# Patient Record
Sex: Female | Born: 1965 | Race: White | Hispanic: No | Marital: Married | Smoking: Never smoker
Health system: Southern US, Community
[De-identification: ages and names within clinical notes are randomized; demographics above are authoritative.]

## PROBLEM LIST (undated history)

## (undated) DIAGNOSIS — G4730 Sleep apnea, unspecified: Secondary | ICD-10-CM

## (undated) DIAGNOSIS — E049 Nontoxic goiter, unspecified: Secondary | ICD-10-CM

## (undated) DIAGNOSIS — Z8614 Personal history of Methicillin resistant Staphylococcus aureus infection: Secondary | ICD-10-CM

## (undated) DIAGNOSIS — M5090 Cervical disc disorder, unspecified, unspecified cervical region: Secondary | ICD-10-CM

## (undated) DIAGNOSIS — E039 Hypothyroidism, unspecified: Secondary | ICD-10-CM

## (undated) DIAGNOSIS — R232 Flushing: Secondary | ICD-10-CM

## (undated) HISTORY — DX: Nontoxic goiter, unspecified: E04.9

## (undated) HISTORY — DX: Personal history of Methicillin resistant Staphylococcus aureus infection: Z86.14

## (undated) HISTORY — DX: Flushing: R23.2

## (undated) HISTORY — DX: Cervical disc disorder, unspecified, unspecified cervical region: M50.90

## (undated) HISTORY — DX: Hypothyroidism, unspecified: E03.9

## (undated) HISTORY — DX: Sleep apnea, unspecified: G47.30

---

## 1992-11-30 HISTORY — PX: BREAST BIOPSY: SHX20

## 1999-09-22 ENCOUNTER — Other Ambulatory Visit: Admission: RE | Admit: 1999-09-22 | Discharge: 1999-09-22 | Payer: Self-pay | Admitting: Obstetrics and Gynecology

## 2000-12-09 ENCOUNTER — Other Ambulatory Visit: Admission: RE | Admit: 2000-12-09 | Discharge: 2000-12-09 | Payer: Self-pay | Admitting: Obstetrics and Gynecology

## 2002-03-27 ENCOUNTER — Other Ambulatory Visit: Admission: RE | Admit: 2002-03-27 | Discharge: 2002-03-27 | Payer: Self-pay | Admitting: Family Medicine

## 2002-10-20 ENCOUNTER — Other Ambulatory Visit: Admission: RE | Admit: 2002-10-20 | Discharge: 2002-10-20 | Payer: Self-pay | Admitting: Obstetrics and Gynecology

## 2003-11-14 ENCOUNTER — Other Ambulatory Visit: Admission: RE | Admit: 2003-11-14 | Discharge: 2003-11-14 | Payer: Self-pay | Admitting: Obstetrics and Gynecology

## 2004-01-07 ENCOUNTER — Ambulatory Visit (HOSPITAL_BASED_OUTPATIENT_CLINIC_OR_DEPARTMENT_OTHER): Admission: RE | Admit: 2004-01-07 | Discharge: 2004-01-07 | Payer: Self-pay | Admitting: General Surgery

## 2005-11-30 HISTORY — PX: UMBILICAL HERNIA REPAIR: SHX196

## 2006-11-30 HISTORY — PX: OOPHORECTOMY: SHX86

## 2006-11-30 HISTORY — PX: TOTAL ABDOMINAL HYSTERECTOMY: SHX209

## 2007-10-12 ENCOUNTER — Emergency Department (HOSPITAL_COMMUNITY): Admission: EM | Admit: 2007-10-12 | Discharge: 2007-10-12 | Payer: Self-pay | Admitting: Emergency Medicine

## 2007-10-24 ENCOUNTER — Ambulatory Visit: Payer: Self-pay | Admitting: Gastroenterology

## 2007-10-26 ENCOUNTER — Ambulatory Visit: Payer: Self-pay | Admitting: Gastroenterology

## 2007-10-31 ENCOUNTER — Ambulatory Visit (HOSPITAL_COMMUNITY): Admission: RE | Admit: 2007-10-31 | Discharge: 2007-10-31 | Payer: Self-pay | Admitting: Gastroenterology

## 2007-11-16 ENCOUNTER — Ambulatory Visit: Payer: Self-pay | Admitting: Gastroenterology

## 2007-11-16 LAB — CONVERTED CEMR LAB
Fecal Occult Blood: NEGATIVE
OCCULT 1: NEGATIVE
OCCULT 2: NEGATIVE
OCCULT 5: NEGATIVE

## 2007-12-26 DIAGNOSIS — E039 Hypothyroidism, unspecified: Secondary | ICD-10-CM | POA: Insufficient documentation

## 2007-12-26 DIAGNOSIS — K209 Esophagitis, unspecified without bleeding: Secondary | ICD-10-CM | POA: Insufficient documentation

## 2007-12-26 DIAGNOSIS — D649 Anemia, unspecified: Secondary | ICD-10-CM

## 2008-01-02 ENCOUNTER — Encounter (HOSPITAL_COMMUNITY): Admission: RE | Admit: 2008-01-02 | Discharge: 2008-01-02 | Payer: Self-pay | Admitting: Internal Medicine

## 2008-02-28 ENCOUNTER — Ambulatory Visit (HOSPITAL_COMMUNITY): Admission: RE | Admit: 2008-02-28 | Discharge: 2008-02-29 | Payer: Self-pay | Admitting: Obstetrics and Gynecology

## 2008-02-28 ENCOUNTER — Encounter (INDEPENDENT_AMBULATORY_CARE_PROVIDER_SITE_OTHER): Payer: Self-pay | Admitting: Obstetrics and Gynecology

## 2011-04-14 NOTE — Discharge Summary (Signed)
Rita Olson, Rita Olson             ACCOUNT NO.:  [id number]   MEDICAL RECORD NO.:  [id number]          PATIENT TYPE:  OIB   LOCATION:  9320                          FACILITY:  [facility]   PHYSICIAN:  Juluis Mire, M.D.   DATE OF BIRTH:  03-22-1966   DATE OF ADMISSION:  02/28/2008  DATE OF DISCHARGE:  02/29/2008                               DISCHARGE SUMMARY   ADMITTING DIAGNOSES:  Menorrhagia.  Right ovarian cyst.   DISCHARGE DIAGNOSIS:  Significant pelvic endometriosis.   PROCEDURES:  Open laparoscopy, lysis of adhesions, laparoscopic-assisted  vaginal hysterectomy, right salpingo-oophorectomy, and cystoscopy.   For complete history and physical, please see dictated note.   COURSE IN THE HOSPITAL:  The patient underwent the above-noted surgery.  Again, she had bilateral endometriomas and severe pelvic endometriosis.  The left ovary was left in place as it was the least involved.  Cystoscopy was performed just to determine that now ureters were entered  and they both appeared to be normal.   Postop, the patient did well, was discharged home first postop day.  At  that time, she was tolerating her diet and ambulating without  difficulty.  Her Foley had been discontinued.  She was voiding without  difficulty.  Her abdomen was soft and nontender.  Bowel sounds were  active.  All incisions were clear.  Her hemoglobin was 9.2.   In terms of complication none were encountered during her stay in  hospital.  The patient was discharged home in stable condition.   DISPOSITION:  Routine postop instructions and orders given.  She is to  avoid heavy lifting, vaginal entrance, or driving of the car.  She is to  watch for signs of infection, increasing nausea or vomiting, active  vaginal bleeding, increase in abdominal pain, signs of deep venous  thrombosis or pulmonary embolus.  Discharged home on Tylox as needed for  pain and follow up in the office in 1 week.      Juluis Mire, M.D.  Electronically Signed     JSM/MEDQ  D:  02/29/2008  T:  02/29/2008  Job:  [id number]

## 2011-04-14 NOTE — Letter (Signed)
October 24, 2007    Gwen Pounds, MD  [address]  [place], [place] [address]   RE:  WINNONA, [name]  MRN:  [id number]  /  DOB:  [birth year]   Dear Dr. Dr. Timothy Lasso:   Upon your kind referral, I had the pleasure of evaluating your patient  and I am pleased to offer my findings.  I saw Rita Olson in the  office today.  Enclosed is a copy of my progress note that details my  findings and recommendations.   Thank you for the opportunity to participate in your patient's care.    Sincerely,      Barbette Hair. Arlyce Dice, MD,FACG  Electronically Signed    RDK/MedQ  DD: 10/24/2007  DT: 10/24/2007  Job #: [id number]

## 2011-04-14 NOTE — Op Note (Signed)
NAME:  Rita Olson, Rita Olson NO.:  [id number]   MEDICAL RECORD NO.:  [id number]         PATIENT TYPE:  WOIB   LOCATION:  9320                          [facility]ILITY:  [facility]   PHYSICIAN:  Juluis Mire, M.D.   DATE OF BIRTH:  20-Jul-1966   DATE OF PROCEDURE:  DATE OF DISCHARGE:                               OPERATIVE REPORT   PREOPERATIVE DIAGNOSES:  Menorrhagia with a right ovarian cyst.   POSTOPERATIVE DIAGNOSES:  Severe pelvic endometriosis with right-sided  endometrioma and pelvic adhesions.   OPERATIVE PROCEDURE:  Open laparoscopy, lysis of adhesions.  Laparoscopic-assisted vaginal hysterectomy with right salpingo-  oophorectomy.  Cystoscopy.   SURGEON:  Juluis Mire, M.D.   ASSISTANT:  Guy Sandifer. Henderson Cloud, M.D.   ESTIMATED BLOOD LOSS:  3-400 mL.   PACKS AND DRAINS:  None.   INTRAOPERATIVE BLOOD REPLACED:  None.   COMPLICATIONS:  None.   INDICATIONS:  As dictated in the history and physical.   DESCRIPTION OF PROCEDURE:  The patient was taken to the OR and placed in  the supine position. After a satisfactory level of general endotracheal  anesthesia was obtained, the patient was placed in the dorsal lithotomy  position using the Allen stirrups.  At this point, the abdomen,  perineum, and vagina were prepped out with Betadine.  The bladder was  emptied by in-and-out catheterization.  A Hulka tenaculum was put in  place and secured.  A subumbilical incision made with a knife and  extended down to the fascia.  The fascia was entered sharply, incision  in the fashioned extended laterally.  The peritoneum was entered with  blunt finger pressure.  There was no evidence of any adhesions in the  periumbilical area.  An open laparoscopic trocar was put in place and  secured, the laparoscope was then introduced.  There was no evidence of  injury to adjacent organs.  A 5-mm trocar was put in place in the  suprapubic area under direct visualization. Elevation of the  uterus  revealed pelvic adhesions, the left ovary was adherent to the left  pelvic sidewall.  The right ovary had a large endometrioma, it was  adherent to the right pelvic sidewall. A second 5-mm trocar was put in  place in the left lower quadrant after we did visualize the epigastric  vessels.  We were able to free the left ovary from its attachment to the  left pelvic sidewall as well as the right ovary.  We could see the  ureter as it passed along the pelvic brim on both sides.  We decided to  remove the right ovary due to its extensive involvement with  endometriosis.  The ovary was elevated, the ovarian vasculature was  isolated above the ureter, cauterized and incised. The mesenteric  attachments of the ovary and tube were then cauterized and incised up to  the round ligament.  The right round ligament was cauterized and  incised.  We then went to the left side, that ovary seemed to be the  least involved. The left utero-ovarian pedicle was cauterized and  incised and the left round ligament was  cauterized and incised.  This  again using the gyrus.  We had good freeing up of both adnexa.  The  decision was to go vaginally.   The laparoscope was removed. The abdomen was deflated with carbon  dioxide.  The patient's legs were repositioned.  The Hulka tenaculum was  then taken out.  A weighted speculum was placed in the vaginal vault.  The cervix was grasped with a Christella Hartigan tenaculum.  The cul-de-sac was  entered sharply.  Both uterosacral ligaments were clamped, cut and  suture ligated with #0 Vicryl.  Reflection of the vaginal mucosa around  the cervix was incised and the bladder was dissected superiorly.  Paracervical tissue was clamped, cut and suture ligated with #0 Vicryl.  The vesicouterine space was identified,  entered sharply and retractor  put in place to retract the bladder superiorly. Using a clamp, cut and  tie technique with suture ligature of #0 Vicryl, the parametrium  was  serially separated inside the uterus.  The uterus was then flipped, the  remaining pedicles were clamped and cut. The uterus, right tube and  ovary were passed off the operative field and sent to pathology.  The  pedicles were secured with free ties of #0 Vicryl.  A uterosacral  plication stitch of #0 bladder was put in place and secured. The vaginal  mucosa was reapproximated with interrupted figure-of-eights of #0  Monocryl.   The patient was given indigo carmine, cystoscopy was performed.  There  was no evidence of any damage to the bladder.  Both ureteral orifices  were easily visualized and noted to be spilling a copious amount of blue-  tinged urine.  The cystoscope was then removed.  A Foley was placed to  straight drain.   The patient's leg was repositioned, abdomen was reinflated with carbon  dioxide.  The laparoscope was reintroduced.  We irrigated the pelvis.  We did have some oozing from the vaginal cuff brought under control with  the gyrus.  We then noticed that the left ovary was free on a pedicle.  We warned about torsion, we decided to try to transfix it to the round  ligament.  A third 5-mm trocar was put in place in the right lower  quadrant after visualization of the epigastric vessels.  An Endoloop of  #0 Vicryl was introduced.  We were able to elevate the ovary and secure  it to the left round ligament without compromising the vasculature.  This was done with the Endoloop, the Endoloop was then trimmed and  removed.  We then thoroughly irrigated the pelvis.  We had good  hemostasis, the abdomen was deflated with carbon dioxide and all trocars  removed.  The  subumbilical fascia was closed with two figure-of-eights of #0 Vicryl,  skin with interrupted subcuticulars of  4-0 Vicryl.  The suprapubic  incisions were closed with Dermabond.  The patient was taken out of the  dorsal lithotomy position once alert and extubated and transferred to  the recovery room  good condition.  Sponge, instrument and needle count  were reported as correct by the circulating nurse x2 and the Foley  catheter was having blue-tinged urine.      Juluis Mire, M.D.  Electronically Signed     JSM/MEDQ  D:  02/28/2008  T:  02/28/2008  Job:  [id number]

## 2011-04-14 NOTE — Assessment & Plan Note (Signed)
HEALTHCARE                         GASTROENTEROLOGY OFFICE NOTE   Rita Olson, Rita Olson                    MRN:          [id number]  DATE:10/24/2007                            DOB:          1966-06-11    REASON FOR CONSULTATION:  1. Chest pain.  2. Anemia.   Rita Olson is a pleasant 45 year old white female referred through  the courtesy of Dr. Timothy Lasso for evaluation.  Three or four days ago she  developed chest pain which prompted evaluation.  Pain was described as a  moderately severe pressure in the middle of her chest that radiated to  her back, left shoulder, and jaw.  She was seen and evaluated by  cardiogram and blood work.  It was felt that her pain was not due to a  cardiac etiology.  An incidental microcytic anemia was noted.  On  October 14, 2007 hemoglobin was 9.6, and MCV was 80.5.  Rita Olson  has no history of melena or hematochezia.  She denies pyrosis or  abdominal pain.  She does have intermittent heavy menstrual periods.  She apparently tested Hemoccult negative in Dr. Ferd Hibbs office.  She is  on no gastric irritants, including nonsteroidals.  She has no history of  ulcerative disease.  She denies pyrosis, dysphagia, cough or  odynophagia.   PAST MEDICAL HISTORY:  Pertinent for thyroid disease.  She is status  post herniorrhaphy.   FAMILY HISTORY:  Noncontributory.   MEDICATIONS:  1. Synthroid.  2. Protonix.   She has no allergies.   She does not smoke, she drinks rarely.  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   REVIEW OF SYSTEMS:  Review of systems was positive for fatigue and some  shortness of breath.   PHYSICAL EXAMINATION:  She is a healthy-appearing female, pulse 66,  blood pressure 138/70, weight 172.  HEENT: EOMI.  PERRLA.  Sclerae are anicteric.  Conjunctivae are pink.  NECK:  Supple without thyromegaly, adenopathy or carotid bruits.  CHEST:  Clear to auscultation and percussion without  adventitious  sounds.  CARDIAC:  Regular rhythm; normal S1 S2.  There are no murmurs, gallops  or rubs.  ABDOMEN:  Bowel sounds are normoactive.  Abdomen is soft, nontender and  nondistended.  There are no abdominal masses, tenderness, splenic  enlargement or hepatomegaly.  EXTREMITIES:  Full range of motion.  No cyanosis, clubbing or edema.  RECTAL:  Deferred.   IMPRESSION:  1. Chest pain:  Noncardiac causes may include esophageal spasm and      possible acute cholecystitis.  This would be an atypical      presentation for active peptic ulcer disease.  She could have acid      reflux triggering esophageal spasm.  2. Iron deficiency anemia:  Concern is for quantity of blood loss,      though she did test Hemoccult negative.  Quite possibly that her      anemia could be related to heavy menstrual blood loss.   RECOMMENDATION:  1. Continue Protonix.  2. Upper endoscopy.  If not diagnostic for both a gastrointestinal  bleeding source and for a source for chest pain, I will obtain an      abdominal ultrasound.  3. Serial Hemoccults.  If negative, I will not pursue her      gastrointestinal workup any further with regards to her anemia.     Barbette Hair. Arlyce Dice, MD,FACG  Electronically Signed    RDK/MedQ  DD: 10/24/2007  DT: 10/24/2007  Job #: [id number]   cc:   Gwen Pounds, MD

## 2011-04-14 NOTE — H&P (Signed)
NAME:  Rita Olson, Rita Olson NO.:  [id number]   MEDICAL RECORD NO.:  [id number]          PATIENT TYPE:  AMB   LOCATION:  SDC                           [facility]ILITY[facility]   PHYSICIAN:  Juluis Mire, M.D.   DATE OF BIRTH:  [birth year]   DATE OF ADMISSION:  02/28/2008  DATE OF DISCHARGE:                              HISTORY & PHYSICAL   The patient is a 45 year old, gravida 2, para 2, married female who  presents for a laparoscopic-assisted vaginal hysterectomy and possible  bilateral salpingo-oophorectomy.   In relation to the present admission,  the patient's cycles have become  increasingly heavy.  She has 7 days of flow, 4 days being heavy,  changing pads and tampons every 2 hours with clots and increasing  dysmenorrhea.  She has had a complete workup of anemia that has been  relatively depressed, at last evaluation it was up to 12.  We did a  saline infusion ultrasound.  She did have numerous small polyps,  probably the biggest significance was adenomyosis.  She did have  bilateral ovarian cysts.  On her preop exam, we repeated the ultrasound  evaluation and she continued to have a right ovarian cyst, probable  endometrioma versus dermoid. The left ovarian cyst and resolved.  We  also been following her with a minimal rectocele. Initially she was  considering a posterior and she decided against that at the present time  so we proceeded with laparoscopic-assisted vaginal hysterectomy,  possible right ovarian cystectomy versus removal of the right ovary.   ALLERGIES:  No known drug allergies.   MEDICATIONS:  Synthroid and iron.   PAST MEDICAL HISTORY:  She does have a history of headaches. History of  anemia.  History of hypothyroidism on Synthroid replacement followed by  Dr. Timothy Lasso.   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   OBSTETRICAL HISTORY:  She has had two vaginal deliveries.   FAMILY HISTORY:  History of  hypertension.   SOCIAL HISTORY:  No tobacco or alcohol use.   REVIEW OF SYSTEMS:  Noncontributory.   PHYSICAL EXAMINATION:  The patient is afebrile with stable vital signs.  HEENT:  The patient is normocephalic.  Pupils equal, round and reactive  to light and accommodation.  Extraocular movements were intact.  Sclerae  and conjunctiva are clear.  Oropharynx clear.  NECK:  Without thyromegaly.  BREASTS:  No discrete masses.  LUNGS:  Clear.  CARDIAC:  Regular rhythm and rate without murmurs or gallops.  ABDOMEN:  Benign.  No mass, organomegaly or tenderness.  PELVIC:  Normal external genitalia.  Vaginal mucosa is clear.  Cervix  unremarkable.  Uterus normal size, shape and contour.  ADNEXA:  Right adnexal fullness, left adnexa unremarkable.  EXTREMITIES:  Trace edema.  NEUROLOGIC:  Grossly within normal limits.   IMPRESSION:  1. Menorrhagia with associated anemia.  2. Right ovarian cyst. Could be dermoid versus endometrioma.  3. Hypothyroidism.  4. Minimal rectocele.   PLAN:  The patient will undergo laparoscopic-assisted vaginal  hysterectomy, possible right salpingo-oophorectomy versus right ovarian  cystectomy.  The risks of surgery have been discussed including the risk  of infection.  The risk of hemorrhage that could require transfusion  with the risk of AIDS or hepatitis. The risk of injury to adjacent  organs including bladder, bowel, ureters that could require further  exploratory surgery. The risk of deep venous thrombosis and pulmonary  embolus.  The patient expressed an understanding of the indications,  risks and other alternatives.      Juluis Mire, M.D.  Electronically Signed     JSM/MEDQ  D:  02/28/2008  T:  02/28/2008  Job:  [id number]

## 2011-04-14 NOTE — Letter (Signed)
October 24, 2007    Ms. Sharice U. Kral   RE:  RAZIYA, AVENI  MRN:  [id number]  /  DOB:  10/28/1966   Dear Ms. Woloszyn:   It is my pleasure to have treated you recently as a new patient in my  office.  I appreciate your confidence and the opportunity to participate  in your care.   Since I do have a busy inpatient endoscopy schedule and office schedule,  my office hours vary weekly.  I am, however, available for emergency  calls every day through my office.  If I cannot promptly meet an urgent  office appointment, another one of our gastroenterologists will be able  to assist you.   My well-trained staff are prepared to help you at all times.  For  emergencies after office hours, a physician from our gastroenterology  section is always available through my 24-hour answering service.   While you are under my care, I encourage discussion of your questions  and concerns, and I will be happy to return your calls as soon as I am  available.   Once again, I welcome you as a new patient and I look forward to a happy  and healthy relationship.    Sincerely,      Barbette Hair. Arlyce Dice, MD,FACG  Electronically Signed   RDK/MedQ  DD: 10/24/2007  DT: 10/24/2007  Job #: [id number]

## 2011-04-17 NOTE — Op Note (Signed)
NAME:  Rita Olson, Rita Olson                       ACCOUNT NO.:  [id number]   MEDICAL RECORD NO.:  [id number]                   PATIENT TYPE:  AMB   LOCATION:  DSC                                  FACILITY:  [facility]   PHYSICIAN:  Anselm Pancoast. Zachery Dakins, M.D.          DATE OF BIRTH:  [birth year]   DATE OF PROCEDURE:  01/07/2004  DATE OF DISCHARGE:                                 OPERATIVE REPORT   PREOPERATIVE DIAGNOSES:  Small umbilical hernia and little supraumbilical  fascial defect.   PROCEDURE:  Closure of umbilical hernia.   ANESTHESIA:  General anesthesia.   INDICATIONS FOR PROCEDURE:  The patient is a 45 year old thin female who was  referred to [place] by [facility] for small symptomatic  umbilical hernia.  She has been as heavy as 180 when she was pregnant, but  she is now approximately 160 and looks thinner, but has a small fascial  defect in the umbilicus and with straining it appears there is a little  second fascial defect right above the umbilicus, that sometimes is not  palpable, but when she strains, it actually pouches out and you can see a  little bump. She is working out for physical fitness and it is symptomatic  and she desires it be repaired.  Hopefully, I can repair it with just  sutures and not require mesh through a little small incision.  The patient  preoperatively was given 1 gram of Kefzol and positioned on the OR table and  then induction of general anesthesia.  The abdomen was prepped with Betadine  surgical scrub and solution and then draped.  I made a little small curved  incision just above the umbilicus, sharp dissection down through the  subcutaneous tissue and then identified the fascia. I then could identify  the little defect that is right above the umbilicus and then actually free  the actual umbilicus from the true umbilical area and little bleeders were  controlled with cautery. There were actually two little defects, neither of  which  was very large, and at first, I tried to close it transversely, but it  appears that the two defects kind of pulls in the tissue too much and then I  removed these and actually closed it vertically with about six sutures of 0  Surgilon. I went wide so that I could get good fascia.  I did not tie the  sutures extremely tight so that they will cut through. Anesthetize this with  Marcaine with Adrenaline for immediate postoperative pain control and then  closed the sutures under direct vision. I did not close the peritoneum, but  this incorporated those since it was such a little small fascial defect, I  closed them in a single layer.  Next, I used a 3-0 Vicryl to kind of  approximate the subcutaneous tissue and hold down the umbilicus. I then used  a 4-0 Monocryl and then a few little simple stitches  of 6-0 nylon since it  is small enough that Steri-Strips are not going to work nicely.   The patient tolerated the procedure nicely and was taken to the recovery  room, extubated, a little antibiotic ointment on a 4x4 had been placed and  she will be released after a short stay.  She has up to two weeks off at  work.  I think she can actually return probably in a week, but as far as her  physical exercising, I would wait about six weeks before resuming that.                                               Anselm Pancoast. Zachery Dakins, M.D.    WJW/MEDQ  D:  01/07/2004  T:  01/07/2004  Job:  [id number]   cc:   Juluis Mire, M.D.  [address] [place]  [place] [address]  Fax: [phone]

## 2011-08-21 LAB — CROSSMATCH: ABO/RH(D): O POS

## 2011-08-24 LAB — CBC
Platelets: 231
RBC: 4.34

## 2011-08-25 LAB — CBC
HCT: 27.2 — ABNORMAL LOW
Hemoglobin: 9.2 — ABNORMAL LOW
MCHC: 33.9
Platelets: 151
RDW: 17.9 — ABNORMAL HIGH
WBC: 10.2

## 2011-09-08 LAB — DIFFERENTIAL
Basophils Absolute: 0.1
Basophils Relative: 1
Eosinophils Relative: 1
Monocytes Absolute: 0.5
Monocytes Relative: 7
Neutro Abs: 5.1

## 2011-09-08 LAB — POCT PREGNANCY, URINE
Operator id: 146091
Preg Test, Ur: NEGATIVE

## 2011-09-08 LAB — D-DIMER, QUANTITATIVE: D-Dimer, Quant: <0.22

## 2011-09-08 LAB — I-STAT 8, (EC8 V) (CONVERTED LAB)
Acid-Base Excess: 1
Sodium: 142
TCO2: 30
pH, Ven: 7.295

## 2011-09-08 LAB — CBC
HCT: 29.1 — ABNORMAL LOW
MCV: 80.1
RBC: 3.63 — ABNORMAL LOW
WBC: 7.7

## 2011-09-08 LAB — POCT CARDIAC MARKERS
Myoglobin, poc: 67.7
Operator id: 146091
Operator id: 146091
Troponin i, poc: <0.05

## 2011-09-08 LAB — POCT I-STAT CREATININE: Creatinine, Ser: 0.8

## 2014-11-05 ENCOUNTER — Encounter: Payer: Self-pay | Admitting: Neurology

## 2014-11-14 ENCOUNTER — Encounter: Payer: Self-pay | Admitting: Neurology

## 2014-11-15 ENCOUNTER — Ambulatory Visit (INDEPENDENT_AMBULATORY_CARE_PROVIDER_SITE_OTHER): Payer: 59 | Admitting: Neurology

## 2014-11-15 ENCOUNTER — Encounter: Payer: Self-pay | Admitting: Neurology

## 2014-11-15 VITALS — BP 125/83 | HR 72 | Temp 98.0°F | Ht 70.0 in | Wt 200.0 lb

## 2014-11-15 DIAGNOSIS — G4761 Periodic limb movement disorder: Secondary | ICD-10-CM

## 2014-11-15 DIAGNOSIS — G2581 Restless legs syndrome: Secondary | ICD-10-CM

## 2014-11-15 DIAGNOSIS — R51 Headache: Secondary | ICD-10-CM

## 2014-11-15 DIAGNOSIS — R519 Headache, unspecified: Secondary | ICD-10-CM

## 2014-11-15 DIAGNOSIS — G4733 Obstructive sleep apnea (adult) (pediatric): Secondary | ICD-10-CM

## 2014-11-15 DIAGNOSIS — R351 Nocturia: Secondary | ICD-10-CM

## 2014-11-15 NOTE — Patient Instructions (Signed)

## 2014-11-15 NOTE — Progress Notes (Signed)
Subjective:    Patient ID: Rita Olson is a 48 y.o. female.  HPI     Huston Foley, MD, PhD [facility] [address] [address] [place], [place] [address]  Dear Dr. Timothy Lasso,  I saw your patient, Rita Olson, upon your kind request in my neurologic clinic today for initial consultation of her sleep disorder, in particular, concern for underlying obstructive sleep apnea. The patient is unaccompanied today. As you know, Rita Olson is a 48 year old right-handed woman with an underlying medical history of overweight state, anemia, esophagitis, and hypothyroidism, who complains of snoring, excessive daytime somnolence, weight gain, morning headaches and witnessed apneas. She has 2 older brothers with sleep apnea who are on CPAP machines. Her father has signs and symptoms of OSA. She had blood work in your office on 10/31/2014 which showed a normal CMP, normal CBC, total cholesterol of 198, LDL of 123, TSH of 0.83, free T4 of 1.2. She gained weight in the last year. Her snoring may be moderate and she makes gasping sounds and has pauses in her breathing. She has a mild morning headache 4-5 times a week.  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  She has occasional RLS symptoms and has been known to kick in her sleep.  She has a late bedtime of midnight to 1 AM. She never really had trouble falling asleep except for recently. She has had perimenopausal symptoms. She has had some night sweats. She has been sleeping restlessly. Wake time is around 7 AM and she wakes up groggy and not well rested. She sometimes naps. She has not fallen asleep while driving. There is no TV in the bedroom. She likes to read or use her eye pad before falling asleep. She drinks one cup of coffee in the morning. She's not a soda drinker. She does not smoke. She drinks alcohol occasionally. She has no pets in her bed or bedroom. She has a 32 year old daughter  and his 31 year old son that live at home. Her husband is a IT sales professional. Sometimes he has to sleep with earplugs because of her snoring she says. She reports excessive daytime somnolence and her Epworth sleepiness score is 11 out of 24 today. She has been a relatively fit person but lately in the last year or so she has gained weight and has not had the energy to exercise. She has nocturia once on an average night.  Her Past Medical History Is Significant For: Past Medical History  Diagnosis Date  . Hypothyroid   . History of MRSA infection     sebaceous cyst  . Goiter   . Cervical disc disease   . Hot flashes     Her Past Surgical History Is Significant For: Past Surgical History  Procedure Laterality Date  . Breast lumpectomy      benign  . Umbilical hernia repair    . Oophorectomy  2008  . Total abdominal hysterectomy  2008    Her Family History Is Significant For: Family History  Problem Relation Age of Onset  . GER disease Father   . Atrial fibrillation Father   . Hypertension Mother   . Sleep apnea Brother     (2)    Her Social History Is Significant For: History   Social History  . Marital Status: Married    Spouse Name: Trey Paula    Number of Children: 2  . Years of Education: 14   Occupational History  .  [employer]   Social History Main Topics  . Smoking status: Never Smoker   . Smokeless tobacco: Never Used  . Alcohol Use: 0.0 oz/week    0 Not specified per week     Comment: occas.  . Drug Use: No  . Sexual Activity: None   Other Topics Concern  . None   Social History Narrative   Patient consumes one cup of caffeine daily    Her Allergies Are:  No Known Allergies:   Her Current Medications Are:  Outpatient Encounter Prescriptions as of 11/15/2014  Medication Sig  . levothyroxine (SYNTHROID, LEVOTHROID) 112 MCG tablet Take 112 mcg by mouth daily before breakfast.  :  Review of Systems:  Out of a complete 14 point review of systems, all  are reviewed and negative with the exception of these symptoms as listed below:   Review of Systems  Constitutional: Positive for fatigue.       Weight gain  HENT: Positive for hearing loss.        Ringing in ears  Eyes:       Loss of vision both eyes  Endocrine: Positive for heat intolerance.  Musculoskeletal:       Aching muscles  Neurological:       Snoring, headache, insomnia, sleepiness  Hematological:       Easy bruising  Psychiatric/Behavioral:       Not enough sleep, decreased energy    Objective:  Neurologic Exam  Physical Exam Physical Examination:   Filed Vitals:   11/15/14 0915  BP: 125/83  Pulse: 72  Temp: 98 F (36.7 C)    General Examination: The patient is a very pleasant 48 y.o. female in no acute distress. She appears well-developed and well-nourished and well groomed.   HEENT: Normocephalic, atraumatic, pupils are equal, round and reactive to light and accommodation. Funduscopic exam is normal with sharp disc margins noted. Extraocular tracking is good without limitation to gaze excursion or nystagmus noted. Normal smooth pursuit is noted. Hearing is grossly intact. Tympanic membranes are clear bilaterally. Face is symmetric with normal facial animation and normal facial sensation. Speech is clear with no dysarthria noted. There is no hypophonia. There is no lip, neck/head, jaw or voice tremor. Neck is supple with full range of passive and active motion. There are no carotid bruits on auscultation. Oropharynx exam reveals: mild mouth dryness, good dental hygiene and moderate airway crowding, due to larger and thicker tongue. She has a narrow airway entry and tonsils are 1+ bilaterally. Uvula is thicker and slightly enlarged. Soft palate appears redundant. Mallampati is class II. Neck size is 13-3/4 inches.  Chest: Clear to auscultation without wheezing, rhonchi or crackles noted.  Heart: S1+S2+0, regular and normal without murmurs, rubs or gallops noted.    Abdomen: Soft, non-tender and non-distended with normal bowel sounds appreciated on auscultation.  Extremities: There is no pitting edema in the distal lower extremities bilaterally. Pedal pulses are intact.  Skin: Warm and dry without trophic changes noted. There are no varicose veins.  Musculoskeletal: exam reveals no obvious joint deformities, tenderness or joint swelling or erythema.   Neurologically:  Mental status: The patient is awake, alert and oriented in all 4 spheres. Her immediate and remote memory, attention, language skills and fund of knowledge are appropriate. There is no evidence of aphasia, agnosia, apraxia or anomia. Speech is clear with normal prosody and enunciation. Thought process is linear. Mood is normal and affect is normal.  Cranial nerves II - XII are  as described above under HEENT exam. In addition: shoulder shrug is normal with equal shoulder height noted. Motor exam: Normal bulk, strength and tone is noted. There is no drift, tremor or rebound. Romberg is negative. Reflexes are 2+ throughout. Babinski: Toes are flexor bilaterally. Fine motor skills and coordination: intact with normal finger taps, normal hand movements, normal rapid alternating patting, normal foot taps and normal foot agility.  Cerebellar testing: No dysmetria or intention tremor on finger to nose testing. Heel to shin is unremarkable bilaterally. There is no truncal or gait ataxia.  Sensory exam: intact to light touch, pinprick, vibration, temperature sense in the upper and lower extremities.  Gait, station and balance: She stands easily. No veering to one side is noted. No leaning to one side is noted. Posture is age-appropriate and stance is narrow based. Gait shows normal stride length and normal pace. No problems turning are noted. She turns en bloc. Tandem walk is unremarkable. Intact toe and heel stance is noted.               Assessment and Plan:   In summary, Rita Olson is a very  pleasant 48 y.o.-year old female with an underlying medical history of overweight state, anemia, esophagitis, and hypothyroidism, with a history and physical exam concerning for obstructive sleep apnea (OSA). She reports morning headaches. She has to go to the bathroom once per night. In addition, she reports restless leg symptoms and has been told that she twitches and kicks in her sleep. I had a long chat with the patient about my findings and the diagnosis of OSA, its prognosis and treatment options. We talked about medical treatments, surgical interventions and non-pharmacological approaches. I explained in particular the risks and ramifications of untreated moderate to severe OSA, especially with respect to developing cardiovascular disease down the Road, including congestive heart failure, difficult to treat hypertension, cardiac arrhythmias, or stroke. Even type 2 diabetes has, in part, been linked to untreated OSA. Symptoms of untreated OSA include daytime sleepiness, memory problems, mood irritability and mood disorder such as depression and anxiety, lack of energy, as well as recurrent headaches, especially morning headaches. We talked about trying to maintain a healthy lifestyle in general, as well as the importance of weight control. I encouraged the patient to eat healthy, exercise daily and keep well hydrated, to keep a scheduled bedtime and wake time routine, to not skip any meals and eat healthy snacks in between meals. I advised the patient not to drive when feeling sleepy. I recommended the following at this time: sleep study with potential positive airway pressure titration. (We will score hypopneas at 4% and split the sleep study into diagnostic and treatment portion, if the estimated. 2 hour AHI is >20/h).   I explained the sleep test procedure to the patient and also outlined possible surgical and non-surgical treatment options of OSA, including the use of a custom-made dental device (which  would require a referral to a specialist dentist or oral surgeon), upper airway surgical options, such as pillar implants, radiofrequency surgery, tongue base surgery, and UPPP (which would involve a referral to an ENT surgeon). Rarely, jaw surgery such as mandibular advancement may be considered.  I also explained the CPAP treatment option to the patient, who indicated that she would be willing to try CPAP if the need arises. I explained the importance of being compliant with PAP treatment, not only for insurance purposes but primarily to improve Her symptoms, and for the patient's long term  health benefit, including to reduce Her cardiovascular risks. I answered all her questions today and the patient was in agreement. I would like to see her back after the sleep study is completed and encouraged her to call with any interim questions, concerns, problems or updates.   Thank you very much for allowing me to participate in the care of this nice patient. If I can be of any further assistance to you please do not hesitate to call me at [phone].  Sincerely,   Huston FoleySaima Antionette Luster, MD, PhD

## 2014-12-02 ENCOUNTER — Ambulatory Visit (INDEPENDENT_AMBULATORY_CARE_PROVIDER_SITE_OTHER): Payer: 59 | Admitting: Neurology

## 2014-12-02 DIAGNOSIS — G4733 Obstructive sleep apnea (adult) (pediatric): Secondary | ICD-10-CM

## 2014-12-02 DIAGNOSIS — G4730 Sleep apnea, unspecified: Secondary | ICD-10-CM

## 2014-12-02 DIAGNOSIS — G4710 Hypersomnia, unspecified: Secondary | ICD-10-CM

## 2014-12-02 DIAGNOSIS — G479 Sleep disorder, unspecified: Secondary | ICD-10-CM

## 2014-12-02 NOTE — Sleep Study (Signed)
Please see the scanned sleep study interpretation located in the Procedure tab within the Chart Review section. 

## 2014-12-12 ENCOUNTER — Telehealth: Payer: Self-pay | Admitting: Neurology

## 2014-12-12 DIAGNOSIS — G4733 Obstructive sleep apnea (adult) (pediatric): Secondary | ICD-10-CM

## 2014-12-12 NOTE — Telephone Encounter (Signed)
Please call and notify the patient that the recent sleep study did confirm the diagnosis of obstructive sleep apnea. OSA is overall mild, but worth treating to see if she feels better after treatment. To that end I recommend treatment for this in the form of autoPAP. I have placed an order in the chart. Please send referral, talk to patient, send report to PCP and referring MD, also a copy to patient and we will need a FU in sleep clinic for 6-8 weeks post-PAP set up. Thanks,   Evette Diclemente, MD, PhD [facility]    

## 2014-12-14 ENCOUNTER — Encounter: Payer: Self-pay | Admitting: Neurology

## 2014-12-19 NOTE — Telephone Encounter (Signed)
After multiple attempts to contact the patient a detailed message was left on the patient's voice mail instructing her that there was a diagnosis of mild sleep disordered breathing and that Dr. Frances FurbishAthar had recommended her be referred to a DME to begin use of an Auto CPAP unit.  Patient was instructed to contact our office if she wanted us to process that referral.  Dr. Timothy Lassousso was faxed a copy of the report and a copy was mailed out to the patient.

## 2014-12-26 ENCOUNTER — Telehealth: Payer: Self-pay | Admitting: *Deleted

## 2015-01-16 ENCOUNTER — Encounter: Payer: Self-pay | Admitting: *Deleted

## 2015-01-16 NOTE — Telephone Encounter (Signed)
Patient finally returned my phone call on 01/16/2015 and was provided the results of her sleep study in which Auto CPAP was recommended.  Patient was in agreement and was referred to [facility] for Auto CPAP set up.

## 2015-12-31 ENCOUNTER — Other Ambulatory Visit: Payer: Self-pay | Admitting: Internal Medicine

## 2015-12-31 DIAGNOSIS — Z1231 Encounter for screening mammogram for malignant neoplasm of breast: Secondary | ICD-10-CM

## 2016-01-01 ENCOUNTER — Encounter: Payer: Self-pay | Admitting: Internal Medicine

## 2016-01-01 ENCOUNTER — Telehealth: Payer: Self-pay | Admitting: Gastroenterology

## 2016-01-01 NOTE — Telephone Encounter (Signed)
Yes.  Thank you.

## 2016-01-01 NOTE — Telephone Encounter (Signed)
Left message for patient to return my call to schedule colon with Dr. Marina Goodell anytime after 02/15/2016.

## 2016-01-17 ENCOUNTER — Ambulatory Visit
Admission: RE | Admit: 2016-01-17 | Discharge: 2016-01-17 | Disposition: A | Discharge status: Home / Self Care | Payer: 59 | Source: Ambulatory Visit | Attending: Internal Medicine | Admitting: Internal Medicine

## 2016-01-17 DIAGNOSIS — Z1231 Encounter for screening mammogram for malignant neoplasm of breast: Secondary | ICD-10-CM

## 2016-02-07 ENCOUNTER — Ambulatory Visit (AMBULATORY_SURGERY_CENTER): Payer: Self-pay | Admitting: *Deleted

## 2016-02-07 VITALS — Ht 70.0 in | Wt 212.2 lb

## 2016-02-07 DIAGNOSIS — Z1211 Encounter for screening for malignant neoplasm of colon: Secondary | ICD-10-CM

## 2016-02-07 MED ORDER — NA SULFATE-K SULFATE-MG SULF 17.5-3.13-1.6 GM/177ML PO SOLN: ORAL | Status: DC

## 2016-02-07 NOTE — Progress Notes (Signed)
No allergies to eggs or soy. No problems with anesthesia.  Pt given Emmi instructions for colonoscopy  No oxygen use  No diet drug use  

## 2016-02-27 ENCOUNTER — Encounter: Payer: Self-pay | Admitting: Internal Medicine

## 2016-03-09 ENCOUNTER — Encounter: Payer: Self-pay | Admitting: Internal Medicine

## 2016-03-09 ENCOUNTER — Ambulatory Visit (AMBULATORY_SURGERY_CENTER): Payer: 59 | Admitting: Internal Medicine

## 2016-03-09 VITALS — BP 133/81 | HR 54 | Temp 97.3°F | Resp 10 | Ht 70.0 in | Wt 212.0 lb

## 2016-03-09 DIAGNOSIS — Z1211 Encounter for screening for malignant neoplasm of colon: Secondary | ICD-10-CM

## 2016-03-09 MED ORDER — SODIUM CHLORIDE 0.9 % IV SOLN: 500.0000 mL | INTRAVENOUS | Status: DC

## 2016-03-09 NOTE — Op Note (Signed)
[facility] Patient Name: Rita Olson Procedure Date: 03/09/2016 9:02 AM MRN: [id number] Endoscopist: Wilhemina Bonito. Marina Goodell , MD Age: 50 Date of Birth: 1966/01/24 Gender: Female Procedure:                Colonoscopy Indications:              Screening for colorectal malignant neoplasm Medicines:                Monitored Anesthesia Care Procedure:                Pre-Anesthesia Assessment:                           - Prior to the procedure, a History and Physical                            was performed, and patient medications and                            allergies were reviewed. The patient's tolerance of                            previous anesthesia was also reviewed. The risks                            and benefits of the procedure and the sedation                            options and risks were discussed with the patient.                            All questions were answered, and informed consent                            was obtained. Prior Anticoagulants: The patient has                            taken no previous anticoagulant or antiplatelet                            agents. ASA Grade Assessment: II - A patient with                            mild systemic disease. After reviewing the risks                            and benefits, the patient was deemed in                            satisfactory condition to undergo the procedure.                           After obtaining informed consent, the colonoscope  was passed under direct vision. Throughout the                            procedure, the patient's blood pressure, pulse, and                            oxygen saturations were monitored continuously. The                            Model CF-HQ190L [id number]) scope was introduced                            through the anus and advanced to the the cecum,                            identified by appendiceal orifice and ileocecal                       valve. The colonoscopy was performed without                            difficulty. The patient tolerated the procedure                            well. The quality of the bowel preparation was                            excellent. The bowel preparation used was SUPREP.                            The ileocecal valve, appendiceal orifice, and                            rectum were photographed. Scope In: 9:11:24 AM Scope Out: 9:28:37 AM Scope Withdrawal Time: 0 hours 12 minutes 37 seconds  Total Procedure Duration: 0 hours 17 minutes 13 seconds  Findings:                 The digital rectal exam was normal.                           Internal hemorrhoids were found during retroflexion.                           The entire examined colon appeared normal on direct                            and retroflexion views. Complications:            No immediate complications. Estimated Blood Loss:     Estimated blood loss: none. Impression:               - Internal hemorrhoids.                           - The entire examined colon is normal on direct and  retroflexion views.                           - No specimens collected. Recommendation:           - Patient has a contact number available for                            emergencies. The signs and symptoms of potential                            delayed complications were discussed with the                            patient. Return to normal activities tomorrow.                            Written discharge instructions were provided to the                            patient.                           - Resume previous diet.                           - Continue present medications.                           - Repeat colonoscopy in 10 years for screening                            purposes. Wilhemina BonitoJohn N. Marina GoodellPerry, MD 03/09/2016 9:34:24 AM This report has been signed electronically. CC Letter to:             Creola CornJohn  Russo, MD

## 2016-03-09 NOTE — Patient Instructions (Signed)
YOU HAD AN ENDOSCOPIC PROCEDURE TODAY AT THE [facility]:   Refer to the procedure report that was given to you for any specific questions about what was found during the examination.  If the procedure report does not answer your questions, please call your gastroenterologist to clarify.  If you requested that your care partner not be given the details of your procedure findings, then the procedure report has been included in a sealed envelope for you to review at your convenience later.  YOU SHOULD EXPECT: Some feelings of bloating in the abdomen. Passage of more gas than usual.  Walking can help get rid of the air that was put into your GI tract during the procedure and reduce the bloating. If you had a lower endoscopy (such as a colonoscopy or flexible sigmoidoscopy) you may notice spotting of blood in your stool or on the toilet paper. If you underwent a bowel prep for your procedure, you may not have a normal bowel movement for a few days.  Please Note:  You might notice some irritation and congestion in your nose or some drainage.  This is from the oxygen used during your procedure.  There is no need for concern and it should clear up in a day or so.  SYMPTOMS TO REPORT IMMEDIATELY:   Following lower endoscopy (colonoscopy or flexible sigmoidoscopy):  Excessive amounts of blood in the stool  Significant tenderness or worsening of abdominal pains  Swelling of the abdomen that is new, acute  Fever of 100F or higher    For urgent or emergent issues, a gastroenterologist can be reached at any hour by calling [phone].   DIET: Your first meal following the procedure should be a small meal and then it is ok to progress to your normal diet. Heavy or fried foods are harder to digest and may make you feel nauseous or bloated.  Likewise, meals heavy in dairy and vegetables can increase bloating.  Drink plenty of fluids but you should avoid alcoholic beverages for 24  hours.  ACTIVITY:  You should plan to take it easy for the rest of today and you should NOT DRIVE or use heavy machinery until tomorrow (because of the sedation medicines used during the test).    FOLLOW UP: Our staff will call the number listed on your records the next business day following your procedure to check on you and address any questions or concerns that you may have regarding the information given to you following your procedure. If we do not reach you, we will leave a message.  However, if you are feeling well and you are not experiencing any problems, there is no need to return our call.  We will assume that you have returned to your regular daily activities without incident.  If any biopsies were taken you will be contacted by phone or by letter within the next 1-3 weeks.  Please call us at [phone] if you have not heard about the biopsies in 3 weeks.    SIGNATURES/CONFIDENTIALITY: You and/or your care partner have signed paperwork which will be entered into your electronic medical record.  These signatures attest to the fact that that the information above on your After Visit Summary has been reviewed and is understood.  Full responsibility of the confidentiality of this discharge information lies with you and/or your care-partner.   Information on hemorrhoids given to you today 

## 2016-03-09 NOTE — Progress Notes (Signed)
Patient denies any allergies to soy or eggs.

## 2016-03-09 NOTE — Progress Notes (Signed)
[facility] recovery, [facility], [facility], [facility].

## 2016-03-10 ENCOUNTER — Telehealth: Payer: Self-pay | Admitting: *Deleted

## 2016-03-10 NOTE — Telephone Encounter (Signed)
Message left

## 2016-12-18 DIAGNOSIS — Z9289 Personal history of other medical treatment: Secondary | ICD-10-CM | POA: Diagnosis not present

## 2016-12-18 DIAGNOSIS — J018 Other acute sinusitis: Secondary | ICD-10-CM | POA: Diagnosis not present

## 2016-12-18 DIAGNOSIS — J029 Acute pharyngitis, unspecified: Secondary | ICD-10-CM | POA: Diagnosis not present

## 2016-12-23 DIAGNOSIS — J029 Acute pharyngitis, unspecified: Secondary | ICD-10-CM | POA: Diagnosis not present

## 2016-12-24 DIAGNOSIS — J039 Acute tonsillitis, unspecified: Secondary | ICD-10-CM | POA: Diagnosis not present

## 2016-12-24 DIAGNOSIS — J029 Acute pharyngitis, unspecified: Secondary | ICD-10-CM | POA: Diagnosis not present

## 2018-02-26 DIAGNOSIS — R0789 Other chest pain: Secondary | ICD-10-CM | POA: Diagnosis not present

## 2018-03-01 DIAGNOSIS — E7849 Other hyperlipidemia: Secondary | ICD-10-CM | POA: Diagnosis not present

## 2018-03-01 DIAGNOSIS — R0789 Other chest pain: Secondary | ICD-10-CM | POA: Diagnosis not present

## 2018-03-01 DIAGNOSIS — E038 Other specified hypothyroidism: Secondary | ICD-10-CM | POA: Diagnosis not present

## 2018-03-01 DIAGNOSIS — R030 Elevated blood-pressure reading, without diagnosis of hypertension: Secondary | ICD-10-CM | POA: Diagnosis not present

## 2018-03-02 ENCOUNTER — Other Ambulatory Visit: Payer: Self-pay | Admitting: Internal Medicine

## 2018-03-02 DIAGNOSIS — E785 Hyperlipidemia, unspecified: Secondary | ICD-10-CM

## 2018-08-08 DIAGNOSIS — R82998 Other abnormal findings in urine: Secondary | ICD-10-CM | POA: Diagnosis not present

## 2018-08-08 DIAGNOSIS — Z0000 Encounter for general adult medical examination without abnormal findings: Secondary | ICD-10-CM | POA: Diagnosis not present

## 2018-08-08 DIAGNOSIS — E038 Other specified hypothyroidism: Secondary | ICD-10-CM | POA: Diagnosis not present

## 2018-08-11 DIAGNOSIS — E7849 Other hyperlipidemia: Secondary | ICD-10-CM | POA: Diagnosis not present

## 2018-08-11 DIAGNOSIS — E038 Other specified hypothyroidism: Secondary | ICD-10-CM | POA: Diagnosis not present

## 2018-08-11 DIAGNOSIS — Z0000 Encounter for general adult medical examination without abnormal findings: Secondary | ICD-10-CM | POA: Diagnosis not present

## 2018-08-11 DIAGNOSIS — Z1389 Encounter for screening for other disorder: Secondary | ICD-10-CM | POA: Diagnosis not present

## 2019-10-23 ENCOUNTER — Other Ambulatory Visit: Payer: Self-pay

## 2019-10-23 DIAGNOSIS — Z20822 Contact with and (suspected) exposure to covid-19: Secondary | ICD-10-CM

## 2019-10-25 LAB — NOVEL CORONAVIRUS, NAA: SARS-CoV-2, NAA: NOT DETECTED

## 2019-11-08 ENCOUNTER — Other Ambulatory Visit: Payer: Self-pay | Admitting: Internal Medicine

## 2019-11-08 DIAGNOSIS — Z1231 Encounter for screening mammogram for malignant neoplasm of breast: Secondary | ICD-10-CM

## 2019-12-07 ENCOUNTER — Ambulatory Visit: Payer: 59 | Attending: Internal Medicine

## 2019-12-07 ENCOUNTER — Other Ambulatory Visit: Payer: Self-pay

## 2019-12-07 DIAGNOSIS — Z20822 Contact with and (suspected) exposure to covid-19: Secondary | ICD-10-CM

## 2019-12-09 LAB — NOVEL CORONAVIRUS, NAA: SARS-CoV-2, NAA: DETECTED — AB

## 2021-03-06 ENCOUNTER — Encounter (INDEPENDENT_AMBULATORY_CARE_PROVIDER_SITE_OTHER): Payer: Self-pay

## 2021-03-06 ENCOUNTER — Ambulatory Visit (INDEPENDENT_AMBULATORY_CARE_PROVIDER_SITE_OTHER): Payer: 59 | Admitting: Internal Medicine

## 2021-04-02 ENCOUNTER — Ambulatory Visit (INDEPENDENT_AMBULATORY_CARE_PROVIDER_SITE_OTHER): Payer: 59 | Admitting: Internal Medicine

## 2021-04-09 ENCOUNTER — Ambulatory Visit (INDEPENDENT_AMBULATORY_CARE_PROVIDER_SITE_OTHER): Payer: 59 | Admitting: Internal Medicine

## 2021-04-10 ENCOUNTER — Encounter (INDEPENDENT_AMBULATORY_CARE_PROVIDER_SITE_OTHER): Payer: Self-pay | Admitting: Internal Medicine

## 2021-04-10 ENCOUNTER — Other Ambulatory Visit: Payer: Self-pay

## 2021-04-10 ENCOUNTER — Ambulatory Visit (INDEPENDENT_AMBULATORY_CARE_PROVIDER_SITE_OTHER): Payer: 59 | Admitting: Internal Medicine

## 2021-04-10 VITALS — BP 140/100 | HR 89 | Temp 97.6°F | Resp 18 | Ht 70.0 in | Wt 217.9 lb

## 2021-04-10 DIAGNOSIS — R5383 Other fatigue: Secondary | ICD-10-CM

## 2021-04-10 DIAGNOSIS — L659 Nonscarring hair loss, unspecified: Secondary | ICD-10-CM

## 2021-04-10 DIAGNOSIS — E039 Hypothyroidism, unspecified: Secondary | ICD-10-CM

## 2021-04-10 DIAGNOSIS — Z131 Encounter for screening for diabetes mellitus: Secondary | ICD-10-CM

## 2021-04-10 DIAGNOSIS — E669 Obesity, unspecified: Secondary | ICD-10-CM

## 2021-04-10 DIAGNOSIS — R232 Flushing: Secondary | ICD-10-CM

## 2021-04-10 DIAGNOSIS — Z13220 Encounter for screening for lipoid disorders: Secondary | ICD-10-CM

## 2021-04-10 DIAGNOSIS — R5381 Other malaise: Secondary | ICD-10-CM | POA: Diagnosis not present

## 2021-04-10 DIAGNOSIS — F520 Hypoactive sexual desire disorder: Secondary | ICD-10-CM

## 2021-04-10 DIAGNOSIS — G4700 Insomnia, unspecified: Secondary | ICD-10-CM

## 2021-04-10 NOTE — Progress Notes (Signed)
Metrics: Intervention Frequency ACO  Documented Smoking Status Yearly  Screened one or more times in 24 months  Cessation Counseling or  Active cessation medication Past 24 months  Past 24 months   Guideline developer: UpToDate (See UpToDate for funding source) Date Released: 2014       Wellness Office Visit  Subjective:  Patient ID: Rita Olson, female    DOB: June 02, 1966  Age: 55 y.o. MRN: [id number]  CC: This pleasant 55 year old lady who comes in as a new patient to get a second opinion.  She was referred by a friend of hers. HPI  She has a longstanding history of hypothyroidism and takes levothyroxine. She complains of hot flashes, night sweats, decreased libido, insomnia, mood changes, weight gain, fatigue constantly. She has been separated from her 55 year old marriage for the last 3 years and she wonders whether this is also causing some symptoms. She previously was quite athletic.  She has gained about 50 pounds in weight and would like to improve this. Past Medical History:  Diagnosis Date  . Cervical disc disease   . Goiter   . History of MRSA infection    sebaceous cyst  . Hot flashes   . Hypothyroid   . Sleep apnea    unable to tolerate cpap   Past Surgical History:  Procedure Laterality Date  . BREAST BIOPSY Right 1994   benign  . OOPHORECTOMY  2008  . TOTAL ABDOMINAL HYSTERECTOMY  2008   fibroids/menorrhagia  . UMBILICAL HERNIA REPAIR  2007     Family History  Problem Relation Age of Onset  . GER disease Father   . Atrial fibrillation Father   . Hypertension Mother   . Sleep apnea Brother        (2)  . Sleep apnea Brother   . 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   Social History   Tobacco Use  . Smoking status: Never Smoker  . Smokeless tobacco: Never Used  Substance Use Topics  . Alcohol use: Yes    Alcohol/week: 3.0 standard  drinks    Types: 3 Cans of beer per week    Current Meds  Medication Sig  . levothyroxine (SYNTHROID, LEVOTHROID) 112 MCG tablet Take 125 mcg by mouth daily before breakfast.      Flowsheet Row Office Visit from 04/10/2021 in [facility]  PHQ-9 Total Score 12      Objective:   Today's Vitals: BP (!) 140/100 (BP Location: Left Arm, Patient Position: Sitting, Cuff Size: Normal) Comment: stressed  Pulse 89   Temp 97.6 F (36.4 C) (Temporal)   Resp 18   Ht 5\' 10"  (1.778 m)   Wt 217 lb 14.4 oz (98.8 kg)   SpO2 96%   BMI 31.27 kg/m  Vitals with BMI 04/10/2021 03/09/2016 03/09/2016  Height 5\' 10"  - -  Weight 217 lbs 14 oz - -  BMI 31.27 - -  Systolic 140 133 05/09/2016  Diastolic 100 81 75  Pulse 89 54 58     Physical Exam  She is obese.  Blood pressure today was elevated.  I have asked her to take home readings and let me know if there is still elevated.     Assessment   1. Acquired hypothyroidism   2. Obesity (BMI 30-39.9)   3. Hair loss   4. Malaise and fatigue   5.  Hot flashes   6. Insomnia, unspecified type   7. Hypoactive sexual desire disorder   8. Screening for diabetes mellitus   9. Screening for lipoid disorders       Tests ordered Orders Placed This Encounter  Procedures  . DHEA-sulfate  . Estradiol  . Progesterone  . T3, free  . T4, free  . TSH  . Testos,Total,Free and SHBG (Female)  . CBC  . COMPLETE METABOLIC PANEL WITH GFR  . Hemoglobin A1c  . Lipid panel     Plan: 1. This 55 year old lady clearly has symptoms relating to hormone deficiencies and would probably benefit from [facility].  Blood work is ordered. 2. I will see her in the next few weeks to discuss all these results and further recommendations.   No orders of the defined types were placed in this encounter.   Wilson Singer, MD

## 2021-04-11 LAB — DHEA-SULFATE: DHEA-SO4: 98 ug/dL (ref 5–167)

## 2021-04-14 LAB — CBC
HCT: 38.7 % (ref 35.0–45.0)
Hemoglobin: 12.8 g/dL (ref 11.7–15.5)
MCH: 30.1 pg (ref 27.0–33.0)
MCHC: 33.1 g/dL (ref 32.0–36.0)
MCV: 91.1 fL (ref 80.0–100.0)
MPV: 10.1 fL (ref 7.5–12.5)
Platelets: 274 10*3/uL (ref 140–400)
RBC: 4.25 10*6/uL (ref 3.80–5.10)
RDW: 12.6 % (ref 11.0–15.0)
WBC: 7.3 10*3/uL (ref 3.8–10.8)

## 2021-04-14 LAB — COMPLETE METABOLIC PANEL WITH GFR
AG Ratio: 1.9 (calc) (ref 1.0–2.5)
ALT: 20 U/L (ref 6–29)
AST: 19 U/L (ref 10–35)
Albumin: 4.3 g/dL (ref 3.6–5.1)
Alkaline phosphatase (APISO): 75 U/L (ref 37–153)
BUN: 24 mg/dL (ref 7–25)
CO2: 27 mmol/L (ref 20–32)
Calcium: 9.2 mg/dL (ref 8.6–10.4)
Chloride: 105 mmol/L (ref 98–110)
Creat: 0.81 mg/dL (ref 0.50–1.05)
GFR, Est African American: 95 mL/min/{1.73_m2} (ref >=60)
GFR, Est Non African American: 82 mL/min/{1.73_m2} (ref >=60)
Globulin: 2.3 g/dL (calc) (ref 1.9–3.7)
Glucose, Bld: 92 mg/dL (ref 65–139)
Potassium: 4 mmol/L (ref 3.5–5.3)
Sodium: 140 mmol/L (ref 135–146)
Total Bilirubin: 0.8 mg/dL (ref 0.2–1.2)
Total Protein: 6.6 g/dL (ref 6.1–8.1)

## 2021-04-14 LAB — LIPID PANEL
Cholesterol: 215 mg/dL — ABNORMAL HIGH (ref <200)
HDL: 59 mg/dL (ref >=50)
LDL Cholesterol (Calc): 126 mg/dL (calc) — ABNORMAL HIGH
Non-HDL Cholesterol (Calc): 156 mg/dL (calc) — ABNORMAL HIGH (ref <130)
Total CHOL/HDL Ratio: 3.6 (calc) (ref <5.0)
Triglycerides: 186 mg/dL — ABNORMAL HIGH (ref <150)

## 2021-04-14 LAB — T3, FREE: T3, Free: 3 pg/mL (ref 2.3–4.2)

## 2021-04-14 LAB — T4, FREE: Free T4: 1.5 ng/dL (ref 0.8–1.8)

## 2021-04-14 LAB — TSH: TSH: 5.01 mIU/L — ABNORMAL HIGH

## 2021-04-14 LAB — HEMOGLOBIN A1C
Hgb A1c MFr Bld: 5.4 % of total Hgb (ref <5.7)
Mean Plasma Glucose: 108 mg/dL
eAG (mmol/L): 6 mmol/L

## 2021-04-14 LAB — TESTOS,TOTAL,FREE AND SHBG (FEMALE)
Free Testosterone: 1.3 pg/mL (ref 0.1–6.4)
Sex Hormone Binding: 47 nmol/L (ref 17–124)
Testosterone, Total, LC-MS-MS: 14 ng/dL (ref 2–45)

## 2021-04-14 LAB — ESTRADIOL: Estradiol: <15 pg/mL

## 2021-04-14 LAB — PROGESTERONE: Progesterone: 0.5 ng/mL

## 2021-04-24 ENCOUNTER — Other Ambulatory Visit (INDEPENDENT_AMBULATORY_CARE_PROVIDER_SITE_OTHER): Payer: Self-pay | Admitting: Internal Medicine

## 2021-04-24 ENCOUNTER — Encounter (INDEPENDENT_AMBULATORY_CARE_PROVIDER_SITE_OTHER): Payer: Self-pay | Admitting: Internal Medicine

## 2021-04-24 MED ORDER — LOSARTAN POTASSIUM 50 MG PO TABS: 50.0000 mg | ORAL_TABLET | Freq: Every day | ORAL | 3 refills | Status: AC

## 2021-04-24 NOTE — Telephone Encounter (Signed)
Please advise 

## 2021-05-12 ENCOUNTER — Ambulatory Visit (INDEPENDENT_AMBULATORY_CARE_PROVIDER_SITE_OTHER): Payer: 59 | Admitting: Internal Medicine

## 2021-05-12 ENCOUNTER — Encounter (INDEPENDENT_AMBULATORY_CARE_PROVIDER_SITE_OTHER): Payer: Self-pay | Admitting: Internal Medicine

## 2021-05-12 ENCOUNTER — Other Ambulatory Visit: Payer: Self-pay

## 2021-05-12 VITALS — BP 128/86 | HR 77 | Temp 96.8°F | Ht 70.0 in | Wt 217.2 lb

## 2021-05-12 DIAGNOSIS — G4700 Insomnia, unspecified: Secondary | ICD-10-CM | POA: Diagnosis not present

## 2021-05-12 DIAGNOSIS — E039 Hypothyroidism, unspecified: Secondary | ICD-10-CM | POA: Diagnosis not present

## 2021-05-12 DIAGNOSIS — R232 Flushing: Secondary | ICD-10-CM | POA: Diagnosis not present

## 2021-05-12 DIAGNOSIS — E669 Obesity, unspecified: Secondary | ICD-10-CM

## 2021-05-12 DIAGNOSIS — F520 Hypoactive sexual desire disorder: Secondary | ICD-10-CM

## 2021-05-12 MED ORDER — ESTRADIOL 0.5 MG PO TABS: 0.5000 mg | ORAL_TABLET | Freq: Every day | ORAL | 3 refills | Status: DC

## 2021-05-12 MED ORDER — PROGESTERONE 200 MG PO CAPS: 200.0000 mg | ORAL_CAPSULE | Freq: Every day | ORAL | 3 refills | Status: AC

## 2021-05-12 NOTE — Progress Notes (Signed)
Metrics: Intervention Frequency ACO  Documented Smoking Status Yearly  Screened one or more times in 24 months  Cessation Counseling or  Active cessation medication Past 24 months  Past 24 months   Guideline developer: UpToDate (See UpToDate for funding source) Date Released: 2014       Wellness Office Visit  Subjective:  Patient ID: Rita Olson, female    DOB: 10-21-1966  Age: 55 y.o. MRN: [id number]  CC: This lady comes in for follow-up regarding her blood work and further recommendations. HPI  Her hypothyroidism is undertreated with elevated TSH and suboptimal free T3 levels.  She continues to have symptoms of hypothyroidism.  She feels very tired has this mental fog and no energy. She also has hot flashes indicative of her postmenopausal symptoms. Past Medical History:  Diagnosis Date   Cervical disc disease    Goiter    History of MRSA infection    sebaceous cyst   Hot flashes    Hypothyroid    Sleep apnea    unable to tolerate cpap   Past Surgical History:  Procedure Laterality Date   BREAST BIOPSY Right 1994   benign   OOPHORECTOMY  2008   TOTAL ABDOMINAL HYSTERECTOMY  2008   fibroids/menorrhagia   UMBILICAL HERNIA REPAIR  2007     Family History  Problem Relation Age of Onset   GER disease Father    Atrial fibrillation Father    Hypertension Mother    Sleep apnea Brother        (2)   Sleep apnea Brother    Colon cancer Neg Hx     Social History   Social History Narrative   Separated for 3 years ,married since 1991.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   Social History   Tobacco Use   Smoking status: Never   Smokeless tobacco: Never  Substance Use Topics   Alcohol use: Yes    Alcohol/week: 3.0 standard drinks    Types: 3 Cans of beer per week    Current Meds  Medication Sig   estradiol (ESTRACE) 0.5 MG tablet Take 1 tablet (0.5 mg total) by mouth daily.   levothyroxine (SYNTHROID) 125 MCG tablet Take 125 mcg by mouth daily before  breakfast.   losartan (COZAAR) 50 MG tablet Take 1 tablet (50 mg total) by mouth daily.   progesterone (PROMETRIUM) 200 MG capsule Take 1 capsule (200 mg total) by mouth daily.     Flowsheet Row Office Visit from 04/10/2021 in [facility]  PHQ-9 Total Score 12       Objective:   Today's Vitals: BP 128/86   Pulse 77   Temp (!) 96.8 F (36 C) (Temporal)   Ht 5\' 10"  (1.778 m)   Wt 217 lb 3.2 oz (98.5 kg)   SpO2 96%   BMI 31.16 kg/m  Vitals with BMI 05/12/2021 04/10/2021 03/09/2016  Height 5\' 10"  5\' 10"  -  Weight 217 lbs 3 oz 217 lbs 14 oz -  BMI 31.16 31.27 -  Systolic 128 140 05/09/2016  Diastolic 86 100 81  Pulse 77 89 54     Physical Exam  She remains obese.  Blood pressure diastolic still elevated but better than last time.     Assessment   1. Acquired hypothyroidism   2. Hot flashes   3. Obesity (BMI 30-39.9)   4. Insomnia, unspecified type   5. Hypoactive sexual desire disorder       Tests ordered No orders of the defined types  were placed in this encounter.    Plan: 1.  After discussion and shared decision making, we will switch the levothyroxine to NP thyroid 90 mg daily.  I have given her samples and if she tolerates we will send the prescription. 2.  I will treat her menopausal symptoms with estradiol and progesterone.  I have discussed with her the difference between synthetic and bioidentical hormones.  She is agreeable.  I have explained possible side effects. 3.  I will see her in about a month's time when we will start discussing nutrition and also later on we will discuss testosterone therapy which she has had experience off and felt well on.     Meds ordered this encounter  Medications   estradiol (ESTRACE) 0.5 MG tablet    Sig: Take 1 tablet (0.5 mg total) by mouth daily.    Dispense:  30 tablet    Refill:  3   progesterone (PROMETRIUM) 200 MG capsule    Sig: Take 1 capsule (200 mg total) by mouth daily.    Dispense:  30 capsule     Refill:  3     Kendyll Huettner Normajean Glasgow, MD

## 2021-05-15 ENCOUNTER — Other Ambulatory Visit (INDEPENDENT_AMBULATORY_CARE_PROVIDER_SITE_OTHER): Payer: Self-pay | Admitting: Internal Medicine

## 2021-05-15 MED ORDER — NIRMATRELVIR/RITONAVIR (PAXLOVID)TABLET: 3.0000 | ORAL_TABLET | Freq: Two times a day (BID) | ORAL | 0 refills | Status: AC

## 2021-05-27 ENCOUNTER — Ambulatory Visit (INDEPENDENT_AMBULATORY_CARE_PROVIDER_SITE_OTHER): Payer: 59 | Admitting: Internal Medicine

## 2021-05-27 ENCOUNTER — Encounter (INDEPENDENT_AMBULATORY_CARE_PROVIDER_SITE_OTHER): Payer: Self-pay | Admitting: Internal Medicine

## 2021-05-27 ENCOUNTER — Other Ambulatory Visit: Payer: Self-pay

## 2021-05-27 VITALS — BP 121/78 | HR 80 | Temp 97.3°F | Resp 18 | Ht 70.0 in | Wt 209.2 lb

## 2021-05-27 DIAGNOSIS — F520 Hypoactive sexual desire disorder: Secondary | ICD-10-CM

## 2021-05-27 DIAGNOSIS — R232 Flushing: Secondary | ICD-10-CM | POA: Diagnosis not present

## 2021-05-27 DIAGNOSIS — E039 Hypothyroidism, unspecified: Secondary | ICD-10-CM | POA: Diagnosis not present

## 2021-05-27 DIAGNOSIS — R5381 Other malaise: Secondary | ICD-10-CM | POA: Diagnosis not present

## 2021-05-27 DIAGNOSIS — R5383 Other fatigue: Secondary | ICD-10-CM

## 2021-05-27 LAB — TSH: TSH: 0.12 mIU/L — ABNORMAL LOW

## 2021-05-27 LAB — T3, FREE: T3, Free: 4.4 pg/mL — ABNORMAL HIGH (ref 2.3–4.2)

## 2021-05-27 MED ORDER — ESTRADIOL 1 MG PO TABS: 1.0000 mg | ORAL_TABLET | Freq: Every morning | ORAL | 3 refills | Status: DC

## 2021-05-27 NOTE — Progress Notes (Signed)
Feel like thyroid is " jacked up". Been working on meals and slow down exercises due to not feeling well still. Felt un balanced.

## 2021-05-27 NOTE — Progress Notes (Signed)
Metrics: Intervention Frequency ACO  Documented Smoking Status Yearly  Screened one or more times in 24 months  Cessation Counseling or  Active cessation medication Past 24 months  Past 24 months   Guideline developer: UpToDate (See UpToDate for funding source) Date Released: 2014       Wellness Office Visit  Subjective:  Patient ID: Rita Olson, female    DOB: [birth year]  Age: 55 y.o. MRN: [id number]  CC: This lady comes in to discuss further regarding her hormones. HPI  Since starting NP thyroid, she has been feeling more tired overall and lack of motivation and mental fog.  Also at times she feels somewhat jittery. However, since starting estradiol and progesterone, she is sleeping better and her mood is better overall.  She denies any headaches with estradiol. She is trying to eat better also. Past Medical History:  Diagnosis Date   Cervical disc disease    Goiter    History of MRSA infection    sebaceous cyst   Hot flashes    Hypothyroid    Sleep apnea    unable to tolerate cpap   Past Surgical History:  Procedure Laterality Date   BREAST BIOPSY Right 1994   benign   OOPHORECTOMY  2008   TOTAL ABDOMINAL HYSTERECTOMY  2008   fibroids/menorrhagia   UMBILICAL HERNIA REPAIR  2007     Family History  Problem Relation Age of Onset   GER disease Father    Atrial fibrillation Father    Hypertension Mother    Sleep apnea Brother        (2)   Sleep apnea Brother    Colon cancer Neg Hx     Social History   Social History Narrative   Separated for 3 years ,married since 1991.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   Social History   Tobacco Use   Smoking status: Never   Smokeless tobacco: Never  Substance Use Topics   Alcohol use: Yes    Alcohol/week: 3.0 standard drinks    Types: 3 Cans of beer per week    Current Meds  Medication Sig   estradiol (ESTRACE) 1 MG tablet Take 1 tablet (1 mg total) by mouth every morning.   losartan (COZAAR) 50 MG tablet  Take 1 tablet (50 mg total) by mouth daily.   NP THYROID 90 MG tablet Take 90 mg by mouth daily.   progesterone (PROMETRIUM) 200 MG capsule Take 1 capsule (200 mg total) by mouth daily.   [DISCONTINUED] estradiol (ESTRACE) 0.5 MG tablet Take 1 tablet (0.5 mg total) by mouth daily.   [DISCONTINUED] levothyroxine (SYNTHROID) 125 MCG tablet Take 125 mcg by mouth daily before breakfast.     Flowsheet Row Office Visit from 04/10/2021 in [facility]  PHQ-9 Total Score 12       Objective:   Today's Vitals: BP 121/78 (BP Location: Right Arm, Patient Position: Sitting, Cuff Size: Normal)   Pulse 80   Temp (!) 97.3 F (36.3 C) (Temporal)   Resp 18   Ht 5\' 10"  (1.778 m)   Wt 209 lb 3.2 oz (94.9 kg)   SpO2 97%   BMI 30.02 kg/m  Vitals with BMI 05/27/2021 05/12/2021 04/10/2021  Height 5\' 10"  5\' 10"  5\' 10"   Weight 209 lbs 3 oz 217 lbs 3 oz 217 lbs 14 oz  BMI 30.02 31.16 31.27  Systolic 121 128 06/10/2021  Diastolic 78 86 100  Pulse 80 77 89     Physical Exam She has  lost 8 pounds since the last time I saw her just 2 weeks ago.      Assessment   1. Hot flashes   2. Acquired hypothyroidism   3. Hypoactive sexual desire disorder   4. Malaise and fatigue       Tests ordered Orders Placed This Encounter  Procedures   T3, free   TSH      Plan: 1.  Continue with NP thyroid 90 mg in the morning and we will check thyroid function.  I have given her samples of NP thyroid 90 mg and NP thyroid 30 mg tablets and we may go higher on the dose depending on the blood work.  I would propose that she takes NP thyroid 90 mg in the morning and NP thyroid 30 mg at lunchtime. 2.  Increase the dose of estradiol to 1 mg daily and I have sent a new prescription.  Continue with progesterone 200 mg at night. 3.  Follow-up as previously scheduled in the middle of July.    Meds ordered this encounter  Medications   estradiol (ESTRACE) 1 MG tablet    Sig: Take 1 tablet (1 mg total) by mouth  every morning.    Dispense:  30 tablet    Refill:  3     Juancarlos Crescenzo Normajean Glasgow, MD

## 2021-06-17 ENCOUNTER — Encounter (INDEPENDENT_AMBULATORY_CARE_PROVIDER_SITE_OTHER): Payer: Self-pay | Admitting: Internal Medicine

## 2021-06-17 ENCOUNTER — Other Ambulatory Visit (INDEPENDENT_AMBULATORY_CARE_PROVIDER_SITE_OTHER): Payer: Self-pay | Admitting: Internal Medicine

## 2021-06-17 ENCOUNTER — Telehealth (INDEPENDENT_AMBULATORY_CARE_PROVIDER_SITE_OTHER): Payer: Self-pay | Admitting: Internal Medicine

## 2021-06-17 ENCOUNTER — Other Ambulatory Visit: Payer: Self-pay

## 2021-06-17 ENCOUNTER — Ambulatory Visit (INDEPENDENT_AMBULATORY_CARE_PROVIDER_SITE_OTHER): Payer: 59 | Admitting: Internal Medicine

## 2021-06-17 VITALS — BP 128/78 | HR 63 | Temp 97.7°F | Ht 70.0 in | Wt 209.8 lb

## 2021-06-17 DIAGNOSIS — E039 Hypothyroidism, unspecified: Secondary | ICD-10-CM | POA: Diagnosis not present

## 2021-06-17 DIAGNOSIS — M25572 Pain in left ankle and joints of left foot: Secondary | ICD-10-CM

## 2021-06-17 DIAGNOSIS — F520 Hypoactive sexual desire disorder: Secondary | ICD-10-CM

## 2021-06-17 DIAGNOSIS — R232 Flushing: Secondary | ICD-10-CM

## 2021-06-17 LAB — PROGESTERONE: Progesterone: 23.4 ng/mL

## 2021-06-17 LAB — ESTRADIOL: Estradiol: 71 pg/mL

## 2021-06-17 MED ORDER — NP THYROID 90 MG PO TABS: 90.0000 mg | ORAL_TABLET | Freq: Every day | ORAL | 1 refills | Status: AC

## 2021-06-17 NOTE — Progress Notes (Signed)
Metrics: Intervention Frequency ACO  Documented Smoking Status Yearly  Screened one or more times in 24 months  Cessation Counseling or  Active cessation medication Past 24 months  Past 24 months   Guideline developer: UpToDate (See UpToDate for funding source) Date Released: 2014       Wellness Office Visit  Subjective:  Patient ID: Rita Olson, female    DOB: 04/15/1966  Age: 55 y.o. MRN: [id number]  CC: This lady comes in for follow-up of hypothyroidism, menopausal symptoms. HPI  Approximately 1 week ago, she injured her left Achilles tendon/left ankle area when she stepped out of a car.  She is able to walk on it and has used ibuprofen and ice. She is tolerating hormones better now and has tolerated the higher dose of estradiol and continues on the same dose of progesterone. She elected to continue with NP thyroid 90 mg daily instead of an additional NP thyroid 30 mg tablet.  She feels reasonably good on this combination for the time being. She still continues to have mental fog. She is focusing now on nutrition more and intermittent fasting.  Her clothes are fitting better now/looser. Past Medical History:  Diagnosis Date   Cervical disc disease    Goiter    History of MRSA infection    sebaceous cyst   Hot flashes    Hypothyroid    Sleep apnea    unable to tolerate cpap   Past Surgical History:  Procedure Laterality Date   BREAST BIOPSY Right 1994   benign   OOPHORECTOMY  2008   TOTAL ABDOMINAL HYSTERECTOMY  2008   fibroids/menorrhagia   UMBILICAL HERNIA REPAIR  2007     Family History  Problem Relation Age of Onset   GER disease Father    Atrial fibrillation Father    Hypertension Mother    Sleep apnea Brother        (2)   Sleep apnea Brother    Colon cancer Neg Hx     Social History   Social History Narrative   Separated for 3 years ,married since 1991.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   Social History   Tobacco Use   Smoking status: Never    Smokeless tobacco: Never  Substance Use Topics   Alcohol use: Yes    Alcohol/week: 3.0 standard drinks    Types: 3 Cans of beer per week    Current Meds  Medication Sig   estradiol (ESTRACE) 1 MG tablet Take 1 tablet (1 mg total) by mouth every morning.   losartan (COZAAR) 50 MG tablet Take 1 tablet (50 mg total) by mouth daily.   NP THYROID 90 MG tablet Take 90 mg by mouth daily.   progesterone (PROMETRIUM) 200 MG capsule Take 1 capsule (200 mg total) by mouth daily.     Flowsheet Row Office Visit from 04/10/2021 in [facility]  PHQ-9 Total Score 12       Objective:   Today's Vitals: BP 128/78   Pulse 63   Temp 97.7 F (36.5 C) (Temporal)   Ht 5\' 10"  (1.778 m)   Wt 209 lb 12.8 oz (95.2 kg)   SpO2 97%   BMI 30.10 kg/m  Vitals with BMI 06/17/2021 05/27/2021 05/12/2021  Height 5\' 10"  5\' 10"  5\' 10"   Weight 209 lbs 13 oz 209 lbs 3 oz 217 lbs 3 oz  BMI 30.1 30.02 31.16  Systolic 128 121 05/14/2021  Diastolic 78 78 86  Pulse 63 80 77  Physical Exam  She looks systemically well.  Weight is stable.     Assessment   1. Acquired hypothyroidism   2. Acute left ankle pain   3. Hypoactive sexual desire disorder   4. Hot flashes       Tests ordered Orders Placed This Encounter  Procedures   Estradiol   Progesterone      Plan: 1.  Continue with NP thyroid 90 mg daily. 2.  I have told her that if her left ankle/Achilles tendon area does not improve, let me know and we will refer to orthopedics. 3.  Continue with the same dose of estradiol 1 mg daily and progesterone 200 mg at night and we will check levels today. 4.  I will see her in about 6 weeks time to see how she is doing and we will discuss testosterone therapy at that time.    No orders of the defined types were placed in this encounter.   Wilson Singer, MD

## 2021-06-17 NOTE — Telephone Encounter (Signed)
Pt came back in to request a refill of NP THYROID 90 MG tablet  be sent to pharmacy [facility] on [address]  Cb#: [phone]

## 2021-06-26 ENCOUNTER — Other Ambulatory Visit (INDEPENDENT_AMBULATORY_CARE_PROVIDER_SITE_OTHER): Payer: Self-pay | Admitting: Internal Medicine

## 2021-07-25 ENCOUNTER — Other Ambulatory Visit (INDEPENDENT_AMBULATORY_CARE_PROVIDER_SITE_OTHER): Payer: Self-pay | Admitting: Nurse Practitioner

## 2021-07-31 ENCOUNTER — Other Ambulatory Visit (INDEPENDENT_AMBULATORY_CARE_PROVIDER_SITE_OTHER): Payer: Self-pay | Admitting: Nurse Practitioner

## 2021-07-31 ENCOUNTER — Ambulatory Visit (INDEPENDENT_AMBULATORY_CARE_PROVIDER_SITE_OTHER): Payer: 59 | Admitting: Internal Medicine

## 2022-04-03 ENCOUNTER — Emergency Department (HOSPITAL_COMMUNITY): Payer: 59

## 2022-04-03 ENCOUNTER — Encounter (HOSPITAL_COMMUNITY): Payer: Self-pay | Admitting: Emergency Medicine

## 2022-04-03 ENCOUNTER — Emergency Department (HOSPITAL_COMMUNITY)
Admission: EM | Admit: 2022-04-03 | Discharge: 2022-04-03 | Disposition: A | Discharge status: Home / Self Care | Payer: 59 | Attending: Student | Admitting: Student

## 2022-04-03 DIAGNOSIS — R5383 Other fatigue: Secondary | ICD-10-CM | POA: Diagnosis not present

## 2022-04-03 DIAGNOSIS — R0789 Other chest pain: Secondary | ICD-10-CM | POA: Diagnosis present

## 2022-04-03 DIAGNOSIS — E039 Hypothyroidism, unspecified: Secondary | ICD-10-CM | POA: Insufficient documentation

## 2022-04-03 DIAGNOSIS — Z79899 Other long term (current) drug therapy: Secondary | ICD-10-CM | POA: Insufficient documentation

## 2022-04-03 DIAGNOSIS — Z20822 Contact with and (suspected) exposure to covid-19: Secondary | ICD-10-CM | POA: Insufficient documentation

## 2022-04-03 LAB — BASIC METABOLIC PANEL
Anion gap: 7 (ref 5–15)
BUN: 19 mg/dL (ref 6–20)
CO2: 26 mmol/L (ref 22–32)
Calcium: 9.4 mg/dL (ref 8.9–10.3)
Chloride: 107 mmol/L (ref 98–111)
Creatinine, Ser: 0.64 mg/dL (ref 0.44–1.00)
GFR, Estimated: >60 mL/min (ref >60)
Glucose, Bld: 114 mg/dL — ABNORMAL HIGH (ref 70–99)
Potassium: 3.8 mmol/L (ref 3.5–5.1)
Sodium: 140 mmol/L (ref 135–145)

## 2022-04-03 LAB — TROPONIN I (HIGH SENSITIVITY)
Troponin I (High Sensitivity): 3 ng/L (ref <18)
Troponin I (High Sensitivity): 3 ng/L (ref <18)

## 2022-04-03 LAB — RESP PANEL BY RT-PCR (FLU A&B, COVID) ARPGX2
Influenza A by PCR: NEGATIVE
Influenza B by PCR: NEGATIVE
SARS Coronavirus 2 by RT PCR: NEGATIVE

## 2022-04-03 LAB — CBC
HCT: 40.4 % (ref 36.0–46.0)
Hemoglobin: 13.5 g/dL (ref 12.0–15.0)
MCH: 30.8 pg (ref 26.0–34.0)
MCHC: 33.4 g/dL (ref 30.0–36.0)
MCV: 92.2 fL (ref 80.0–100.0)
Platelets: 236 10*3/uL (ref 150–400)
RBC: 4.38 MIL/uL (ref 3.87–5.11)
RDW: 12.6 % (ref 11.5–15.5)
WBC: 5 10*3/uL (ref 4.0–10.5)
nRBC: 0 % (ref 0.0–0.2)

## 2022-04-03 LAB — TSH: TSH: 1.898 u[IU]/mL (ref 0.350–4.500)

## 2022-04-03 MED ORDER — KETOROLAC TROMETHAMINE 15 MG/ML IJ SOLN: 15.0000 mg | Freq: Once | INTRAMUSCULAR | Status: DC

## 2022-04-03 NOTE — ED Triage Notes (Signed)
Pt c/o feeling "weird" yesterday and having HTN. Pt states left sided chest pressure that started yesterday as well.  ?

## 2022-04-03 NOTE — ED Provider Notes (Signed)
?[facility] ?Provider Note ? ?CSN: [id number] ?Arrival date & time: 04/03/22 0559 ? ?Chief Complaint(s) ?Chest Pain ? ?HPI ?Rita Olson is a 56 y.o. female with PMH acquired hypothyroidism, hot flashes, iron deficiency anemia who presents emergency department for evaluation of chest pain and fatigue.  Patient states that starting last night she had an abnormal chest tightness type feeling that extended into the neck and persisted into this morning.  She has no associated shortness of breath, diaphoresis, nausea, vomiting.  She states that this sensation is nonexertional and she is able to walk without difficulty but states she feels occasionally more winded.  Denies diarrhea, cough, headache, fever or other systemic symptoms.  She states the last time she felt like that she was found to have severe iron deficiency anemia or issues with her thyroid. ? ? ?Past Medical History ?Past Medical History:  ?Diagnosis Date  ? Cervical disc disease   ? Goiter   ? History of MRSA infection   ? sebaceous cyst  ? Hot flashes   ? Hypothyroid   ? Sleep apnea   ? unable to tolerate cpap  ? ?Patient Active Problem List  ? Diagnosis Date Noted  ? Hypothyroidism 12/26/2007  ? ANEMIA 12/26/2007  ? ESOPHAGITIS 12/26/2007  ? ?Home Medication(s) ?Prior to Admission medications   ?Medication Sig Start Date End Date Taking? Authorizing Provider  ?estradiol (ESTRACE) 1 MG tablet TAKE 1 TABLET BY MOUTH EVERY DAY IN THE MORNING 07/30/21   Elenore Paddy, NP  ?losartan (COZAAR) 50 MG tablet Take 1 tablet (50 mg total) by mouth daily. 04/24/21   Wilson Singer, MD  ?NP THYROID 90 MG tablet Take 1 tablet (90 mg total) by mouth daily. 06/17/21   Wilson Singer, MD  ?progesterone (PROMETRIUM) 200 MG capsule Take 1 capsule (200 mg total) by mouth daily. 05/12/21   Wilson Singer, MD  ?                                                                                                                                  ?Past  Surgical History ?Past Surgical History:  ?Procedure Laterality Date  ? BREAST BIOPSY Right 1994  ? benign  ? OOPHORECTOMY  2008  ? TOTAL ABDOMINAL HYSTERECTOMY  2008  ? fibroids/menorrhagia  ? UMBILICAL HERNIA REPAIR  2007  ? ?Family History ?Family History  ?Problem Relation Age of Onset  ? GER disease Father   ? Atrial fibrillation Father   ? Hypertension Mother   ? Sleep apnea Brother   ?     (2)  ? Sleep apnea Brother   ? Colon cancer Neg Hx   ? ? ?Social History ?Social History  ? ?Tobacco Use  ? Smoking status: Never  ? Smokeless tobacco: Never  ?Vaping Use  ? Vaping Use: Never used  ?Substance Use Topics  ? Alcohol use: Yes  ?  Alcohol/week: 3.0 standard drinks  ?  Types: 3 Cans of beer per week  ? Drug use: No  ? ?Allergies ?Patient has no known allergies. ? ?Review of Systems ?Review of Systems  ?Constitutional:  Positive for fatigue.  ?Respiratory:  Positive for chest tightness.   ?Cardiovascular:  Positive for chest pain.  ? ?Physical Exam ?Vital Signs  ?I have reviewed the triage vital signs ?BP (!) 164/88   Pulse (!) 56   Temp 98.4 ?F (36.9 ?C) (Oral)   Resp 19   Ht 5\' 10"  (1.778 m)   Wt 93.4 kg   SpO2 98%   BMI 29.56 kg/m?  ? ?Physical Exam ?Vitals and nursing note reviewed.  ?Constitutional:   ?   General: She is not in acute distress. ?   Appearance: She is well-developed.  ?HENT:  ?   Head: Normocephalic and atraumatic.  ?Eyes:  ?   Conjunctiva/sclera: Conjunctivae normal.  ?Cardiovascular:  ?   Rate and Rhythm: Normal rate and regular rhythm.  ?   Heart sounds: No murmur heard. ?Pulmonary:  ?   Effort: Pulmonary effort is normal. No respiratory distress.  ?   Breath sounds: Normal breath sounds.  ?Abdominal:  ?   Palpations: Abdomen is soft.  ?   Tenderness: There is no abdominal tenderness.  ?Musculoskeletal:     ?   General: No swelling.  ?   Cervical back: Neck supple.  ?Skin: ?   General: Skin is warm and dry.  ?   Capillary Refill: Capillary refill takes less than 2 seconds.   ?Neurological:  ?   Mental Status: She is alert.  ?Psychiatric:     ?   Mood and Affect: Mood normal.  ? ? ?ED Results and Treatments ?Labs ?(all labs ordered are listed, but only abnormal results are displayed) ?Labs Reviewed  ?BASIC METABOLIC PANEL - Abnormal; Notable for the following components:  ?    Result Value  ? Glucose, Bld 114 (*)   ? All other components within normal limits  ?RESP PANEL BY RT-PCR (FLU A&B, COVID) ARPGX2  ?CBC  ?TSH  ?T3, FREE  ?TROPONIN I (HIGH SENSITIVITY)  ?TROPONIN I (HIGH SENSITIVITY)  ?                                                                                                                       ? ?Radiology ?DG Chest 2 View ? ?Result Date: 04/03/2022 ?CLINICAL DATA:  56 year old female with history of chest pain. EXAM: CHEST - 2 VIEW COMPARISON:  Chest x-ray 10/12/2007. FINDINGS: Lung volumes are normal. No consolidative airspace disease. No pleural effusions. No pneumothorax. No pulmonary nodule or mass noted. Pulmonary vasculature and the cardiomediastinal silhouette are within normal limits. IMPRESSION: No radiographic evidence of acute cardiopulmonary disease. Electronically Signed   By: 13/10/2007 M.D.   On: 04/03/2022 06:49   ? ?Pertinent labs & imaging results that were available during my care of the patient were reviewed by me and considered in my medical decision making (see MDM for  details). ? ?Medications Ordered in ED ?Medications - No data to display                                                               ?                                                                    ?Procedures ?Procedures ? ?(including critical care time) ? ?Medical Decision Making / ED Course ? ? ?This patient presents to the ED for concern of chest tightness, fatigue, this involves an extensive number of treatment options, and is a complaint that carries with it a high risk of complications and morbidity.  The differential diagnosis includes hypothyroidism, pneumonia, ACS,  PE, viral illness ? ?MDM: ?Patient seen in the emergency room for evaluation of multiple complaints as described above.  Physical exam is large unremarkable with no appreciable palpable goiter, no bruits heard in the neck, cardiopulmonary exam unremarkable.  Laboratory evaluation including troponin and delta troponin unremarkable.  TSH is also normal.  Chest x-ray unremarkable.  ECG nonischemic.  Patient has a heart score of 1 and thus I have lower suspicion for ACS at this time.  We had a very long discussion about D-dimer testing and the patient is declining this at this time.  This is not unreasonable given the patient has had no significant shortness of breath or pleurisy, no tachycardia here in the emergency department but I informed the patient that we cannot reliably rule out aortic dissection or pulmonary embolism of which she voiced understanding.  Using shared decision making, the patient is requesting to be discharged with outpatient follow-up and strict return precautions and this is reasonable given her overall negative work-up in the emergency department.  Patient then discharged with PCP follow-up. ? ? ?Additional history obtained: ?-Additional history obtained from husband  ?-External records from outside source obtained and reviewed including: Chart review including previous notes, labs, imaging, consultation notes ? ? ?Lab Tests: ?-I ordered, reviewed, and interpreted labs.   ?The pertinent results include:   ?Labs Reviewed  ?BASIC METABOLIC PANEL - Abnormal; Notable for the following components:  ?    Result Value  ? Glucose, Bld 114 (*)   ? All other components within normal limits  ?RESP PANEL BY RT-PCR (FLU A&B, COVID) ARPGX2  ?CBC  ?TSH  ?T3, FREE  ?TROPONIN I (HIGH SENSITIVITY)  ?TROPONIN I (HIGH SENSITIVITY)  ?  ? ? ?EKG  ? EKG Interpretation ? ?Date/Time:  Friday Apr 03 2022 06:16:42 EDT ?Ventricular Rate:  70 ?PR Interval:  154 ?QRS Duration: 86 ?QT Interval:  442 ?QTC Calculation: 477 ?R  Axis:   36 ?Text Interpretation: Normal sinus rhythm Normal ECG When compared with ECG of 12-Oct-2007 16:31, No significant change was found Confirmed by Roosevelt Eimers ([id number]) on 04/03/2022 7:55:49 AM ?  ? ?  ? ? ? ?Imagi

## 2022-04-04 LAB — T3, FREE: T3, Free: 2.7 pg/mL (ref 2.0–4.4)

## 2023-01-18 IMAGING — DX DG CHEST 2V
2 series · 2 of 2 positions shown · non-contrast
Comparison: Chest x-ray 10/12/2007.

CLINICAL DATA: 56-year-old female with history of chest pain.

EXAM:
CHEST - 2 VIEW

[chest pa]
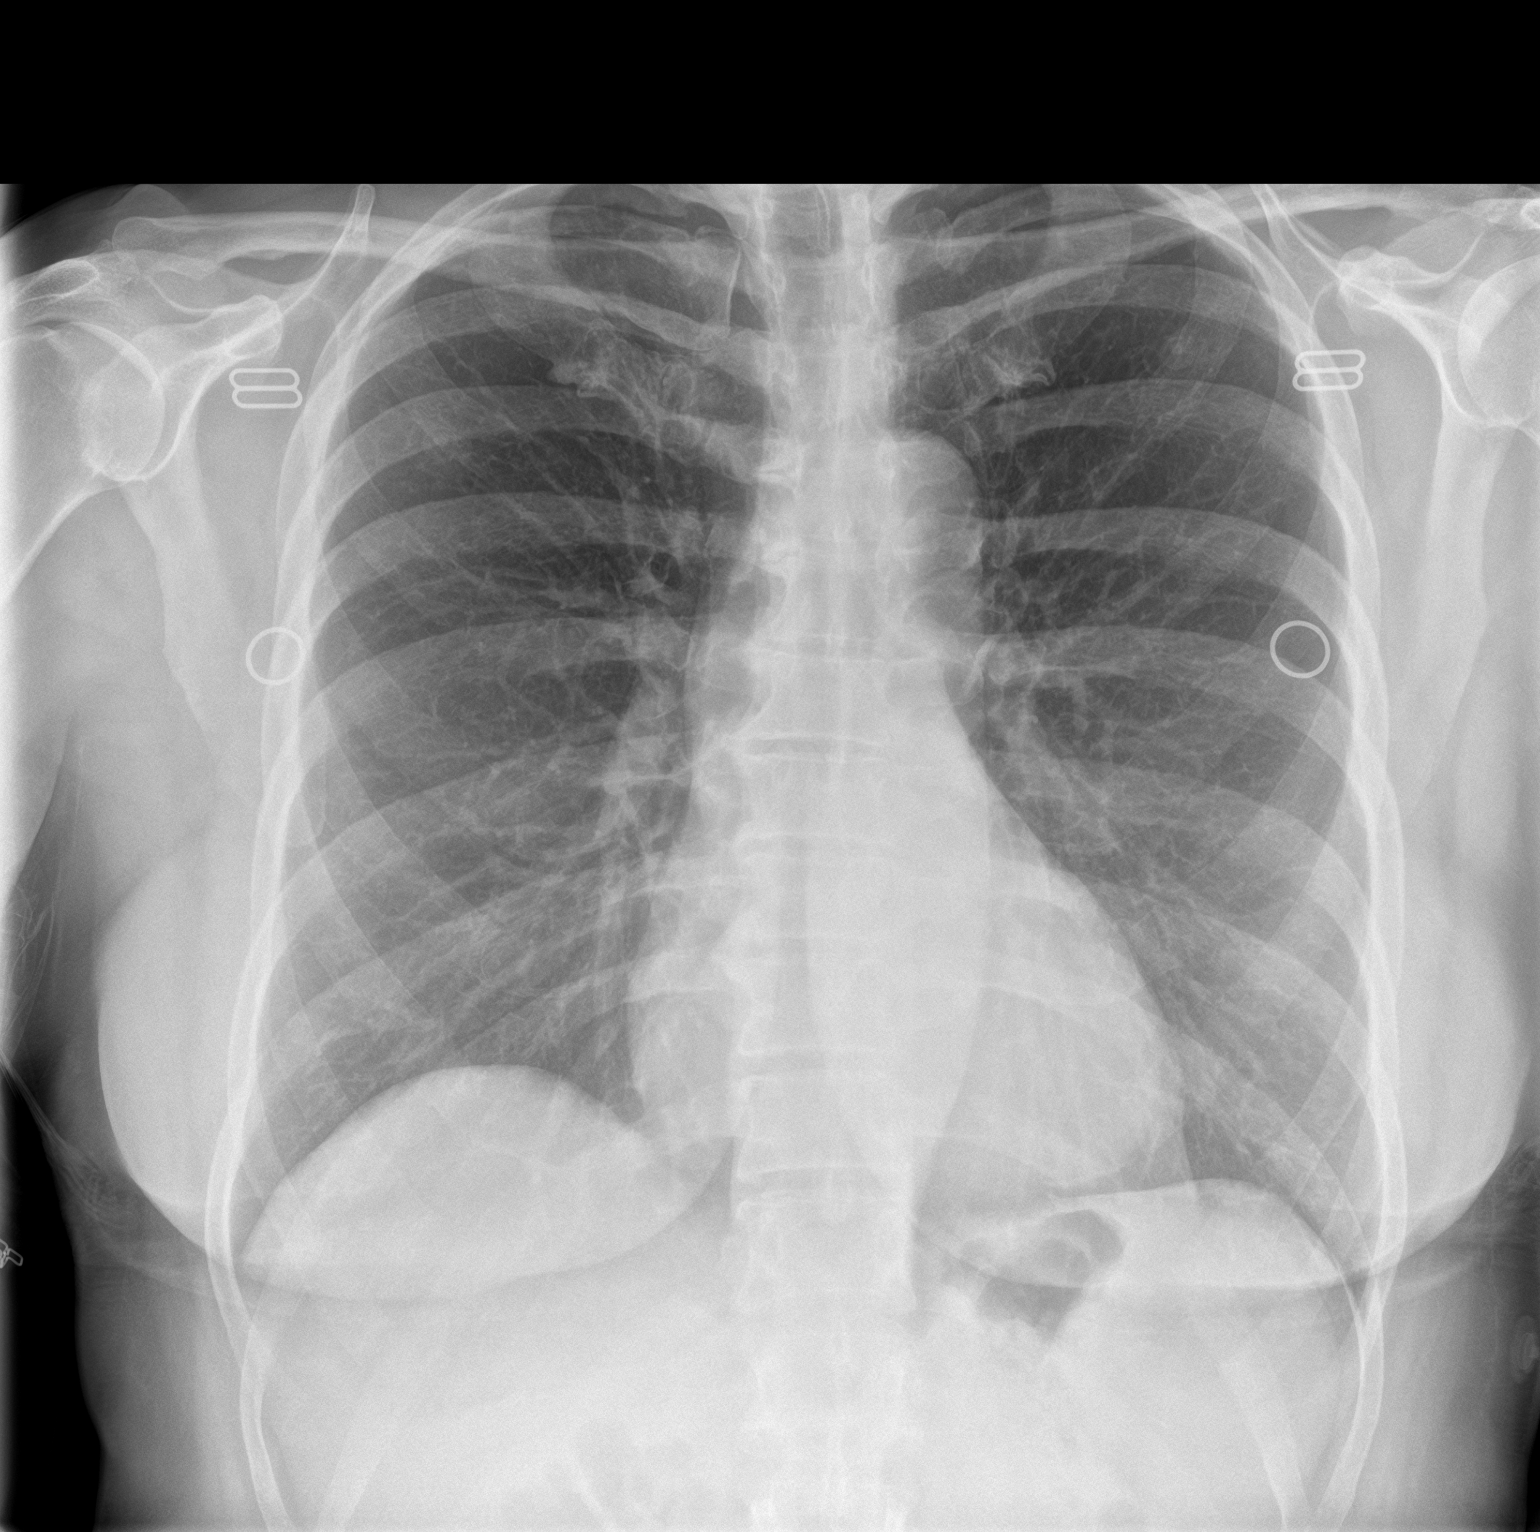

[chest lat]
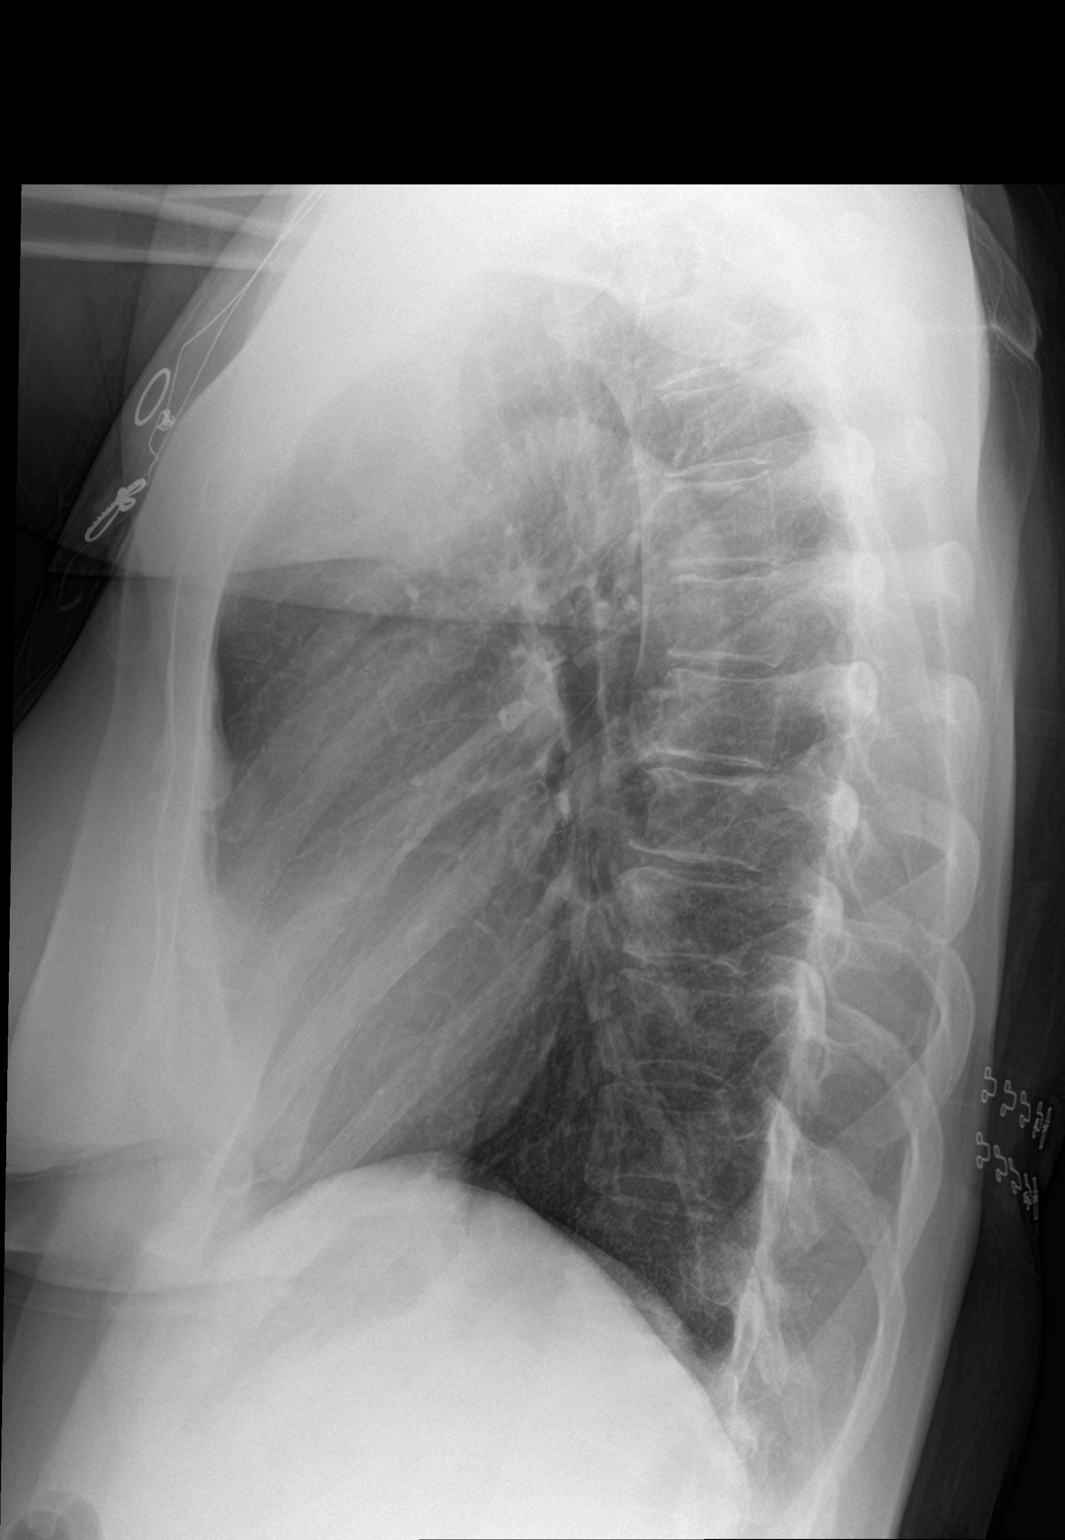

[2 of 2 positions shown; findings below may reference images not displayed]

FINDINGS: Lung volumes are normal. No consolidative airspace disease. No
pleural effusions. No pneumothorax. No pulmonary nodule or mass
noted. Pulmonary vasculature and the cardiomediastinal silhouette
are within normal limits.
IMPRESSION: No radiographic evidence of acute cardiopulmonary disease.

## 2024-10-24 ENCOUNTER — Other Ambulatory Visit: Payer: Self-pay | Admitting: Nurse Practitioner

## 2024-10-24 DIAGNOSIS — E039 Hypothyroidism, unspecified: Secondary | ICD-10-CM

## 2024-10-24 DIAGNOSIS — R5383 Other fatigue: Secondary | ICD-10-CM

## 2024-11-02 ENCOUNTER — Ambulatory Visit: Admission: RE | Admit: 2024-11-02 | Discharge: 2024-11-02 | Disposition: A | Source: Ambulatory Visit

## 2024-11-02 DIAGNOSIS — R5383 Other fatigue: Secondary | ICD-10-CM

## 2024-11-02 DIAGNOSIS — E039 Hypothyroidism, unspecified: Secondary | ICD-10-CM
